# Patient Record
Sex: Male | Born: 1985 | Race: White | Hispanic: No | Marital: Married | Smoking: Never smoker
Health system: Southern US, Community
[De-identification: ages and names within clinical notes are randomized; demographics above are authoritative.]

## PROBLEM LIST (undated history)

## (undated) DIAGNOSIS — E785 Hyperlipidemia, unspecified: Secondary | ICD-10-CM

## (undated) DIAGNOSIS — Z720 Tobacco use: Secondary | ICD-10-CM

## (undated) DIAGNOSIS — I2510 Atherosclerotic heart disease of native coronary artery without angina pectoris: Secondary | ICD-10-CM

## (undated) HISTORY — DX: Atherosclerotic heart disease of native coronary artery without angina pectoris: I25.10

## (undated) HISTORY — PX: NO PAST SURGERIES: SHX2092

## (undated) HISTORY — DX: Hyperlipidemia, unspecified: E78.5

---

## 2017-12-13 ENCOUNTER — Other Ambulatory Visit: Payer: Self-pay

## 2017-12-13 ENCOUNTER — Encounter: Payer: Self-pay | Admitting: Emergency Medicine

## 2017-12-13 ENCOUNTER — Emergency Department: Payer: Worker's Compensation

## 2017-12-13 ENCOUNTER — Emergency Department
Admission: EM | Admit: 2017-12-13 | Discharge: 2017-12-13 | Disposition: A | Discharge status: Home / Self Care | Payer: Self-pay | Attending: Emergency Medicine | Admitting: Emergency Medicine

## 2017-12-13 DIAGNOSIS — M5442 Lumbago with sciatica, left side: Secondary | ICD-10-CM | POA: Insufficient documentation

## 2017-12-13 MED ORDER — CYCLOBENZAPRINE HCL 10 MG PO TABS
5.0000 mg | ORAL_TABLET | Freq: Once | ORAL | Status: AC
  Administered 2017-12-13: 5 mg via ORAL
  Filled 2017-12-13: qty 1

## 2017-12-13 MED ORDER — CYCLOBENZAPRINE HCL 5 MG PO TABS: ORAL_TABLET | ORAL | 0 refills | Status: DC

## 2017-12-13 MED ORDER — PREDNISONE 10 MG PO TABS: ORAL_TABLET | ORAL | 0 refills | Status: DC

## 2017-12-13 MED ORDER — METHYLPREDNISOLONE SODIUM SUCC 125 MG IJ SOLR
125.0000 mg | Freq: Once | INTRAMUSCULAR | Status: AC
  Administered 2017-12-13: 125 mg via INTRAMUSCULAR
  Filled 2017-12-13: qty 2

## 2017-12-13 MED ORDER — LIDOCAINE 5 % EX PTCH: 1.0000 | MEDICATED_PATCH | CUTANEOUS | 0 refills | Status: DC

## 2017-12-13 MED ORDER — OXYCODONE-ACETAMINOPHEN 5-325 MG PO TABS: 1.0000 | ORAL_TABLET | Freq: Four times a day (QID) | ORAL | 0 refills | Status: DC | PRN

## 2017-12-13 MED ORDER — OXYCODONE-ACETAMINOPHEN 5-325 MG PO TABS
1.0000 | ORAL_TABLET | Freq: Once | ORAL | Status: AC
  Administered 2017-12-13: 1 via ORAL
  Filled 2017-12-13: qty 1

## 2017-12-13 MED ORDER — LIDOCAINE 5 % EX PTCH
1.0000 | MEDICATED_PATCH | CUTANEOUS | Status: DC
  Administered 2017-12-13: 1 via TRANSDERMAL
  Filled 2017-12-13: qty 1

## 2017-12-13 MED ORDER — KETOROLAC TROMETHAMINE 30 MG/ML IJ SOLN
30.0000 mg | Freq: Once | INTRAMUSCULAR | Status: AC
  Administered 2017-12-13: 30 mg via INTRAMUSCULAR
  Filled 2017-12-13: qty 1

## 2017-12-13 MED ORDER — KETOROLAC TROMETHAMINE 10 MG PO TABS: 10.0000 mg | ORAL_TABLET | Freq: Four times a day (QID) | ORAL | 0 refills | Status: DC | PRN

## 2017-12-13 NOTE — ED Notes (Signed)
meds given as ordered. Awaiting xray results and disposition

## 2017-12-13 NOTE — ED Provider Notes (Signed)
[facility] Emergency Department Provider Note  ____________________________________________  Time seen: Approximately 12:42 PM  I have reviewed the triage vital signs and the nursing notes.   HISTORY  Chief Complaint Back Injury    HPI Jim Medina is a 32 y.o. male presents emergency department for evaluation of low back pain after injury at work 2 days ago.  Patient was lifting 2 by fours at work when he felt immediate pain in his back that took him to his knees.  He occasionally gets shooting pains down his left leg.  He is tried icing back and taking Tylenol and ibuprofen over the weekend but pain has continued.  No bowel or bladder dysfunction or saddle anesthesias.  No numbness, tingling, weakness to lower extremities.  History reviewed. No pertinent past medical history.  There are no active problems to display for this patient.   History reviewed. No pertinent surgical history.  Prior to Admission medications   Medication Sig Start Date End Date Taking? Authorizing Provider  cyclobenzaprine (FLEXERIL) 5 MG tablet Take 1-2 tablets 3 times daily as needed 12/13/17   Enid Derry, PA-C  ketorolac (TORADOL) 10 MG tablet Take 1 tablet (10 mg total) by mouth every 6 (six) hours as needed. 12/13/17   Enid Derry, PA-C  lidocaine (LIDODERM) 5 % Place 1 patch onto the skin daily. Remove & Discard patch within 12 hours or as directed by MD 12/13/17   Enid Derry, PA-C  oxyCODONE-acetaminophen (PERCOCET) 5-325 MG tablet Take 1 tablet by mouth every 6 (six) hours as needed for severe pain. 12/13/17 12/13/18  Enid Derry, PA-C  predniSONE (DELTASONE) 10 MG tablet Take 6 tablets on day 1, take 5 tablets on day 2, take 4 tablets on day 3, take 3 tablets on day 4, take 2 tablets on day 5, take 1 tablet on day 6 12/13/17   Enid Derry, PA-C    Allergies Patient has no known allergies.  No family history on file.  Social History Social History    Tobacco Use  . Smoking status: Never Smoker  . Smokeless tobacco: Never Used  Substance Use Topics  . Alcohol use: Not on file  . Drug use: Not on file     Review of Systems  Constitutional: No fever/chills ENT: No upper respiratory complaints. Cardiovascular: No chest pain. Respiratory: No cough. No SOB. Gastrointestinal: No abdominal pain.  No nausea, no vomiting.  Musculoskeletal: Positive for back pain.  Skin: Negative for rash, abrasions, lacerations, ecchymosis. Neurological: Negative for headaches, numbness or tingling   ____________________________________________   PHYSICAL EXAM:  VITAL SIGNS: ED Triage Vitals  Enc Vitals Group     BP 12/13/17 1140 (!) 192/102     Pulse Rate 12/13/17 1140 72     Resp 12/13/17 1140 14     Temp 12/13/17 1140 98.6 F (37 C)     Temp Source 12/13/17 1140 Oral     SpO2 12/13/17 1140 96 %     Weight 12/13/17 1137 215 lb (97.5 kg)     Height 12/13/17 1137 5\' 10"  (1.778 m)     Head Circumference --      Peak Flow --      Pain Score 12/13/17 1136 8     Pain Loc --      Pain Edu? --      Excl. in GC? --      Constitutional: Alert and oriented. Well appearing and in no acute distress. Eyes: Conjunctivae are normal. PERRL. EOMI. Head:  Atraumatic. ENT:      Ears:      Nose: No congestion/rhinnorhea.      Mouth/Throat: Mucous membranes are moist.  Neck: No stridor.  Cardiovascular: Normal rate, regular rhythm.  Good peripheral circulation. Respiratory: Normal respiratory effort without tachypnea or retractions. Lungs CTAB. Good air entry to the bases with no decreased or absent breath sounds. Musculoskeletal: Full range of motion to all extremities. No gross deformities appreciated.  Tenderness to palpation to mid lumbar spine.  Strength and sensation equal in lower extremity's bilaterally.  No foot drop.  Antalgic gait Neurologic:  Normal speech and language. No gross focal neurologic deficits are appreciated.  Skin:  Skin is  warm, dry and intact. No rash noted. Psychiatric: Mood and affect are normal. Speech and behavior are normal. Patient exhibits appropriate insight and judgement.   ____________________________________________   LABS (all labs ordered are listed, but only abnormal results are displayed)  Labs Reviewed - No data to display ____________________________________________  EKG   ____________________________________________  RADIOLOGY Lexine Baton, personally viewed and evaluated these images (plain radiographs) as part of my medical decision making, as well as reviewing the written report by the radiologist.  Dg Lumbar Spine 2-3 Views  Result Date: 12/13/2017 CLINICAL DATA:  Injury to lower back while moving heavy objects. Central pain. EXAM: LUMBAR SPINE - 2-3 VIEW COMPARISON:  None. FINDINGS: Transitional S1, unusually well visualized S1-S2 interspace. There is no evidence of lumbar spine fracture. Alignment is normal. Intervertebral disc spaces are maintained, except for disc space narrowing at L5-S1. No visible pars defects. IMPRESSION: No acute findings.  DDD L5-S1 Electronically Signed   By: Elsie Stain M.D.   On: 12/13/2017 13:20    ____________________________________________    PROCEDURES  Procedure(s) performed:    Procedures    Medications  lidocaine (LIDODERM) 5 % 1 patch (1 patch Transdermal Patch Applied 12/13/17 1249)  oxyCODONE-acetaminophen (PERCOCET/ROXICET) 5-325 MG per tablet 1 tablet (has no administration in time range)  methylPREDNISolone sodium succinate (SOLU-MEDROL) 125 mg/2 mL injection 125 mg (has no administration in time range)  ketorolac (TORADOL) 30 MG/ML injection 30 mg (30 mg Intramuscular Given 12/13/17 1250)  cyclobenzaprine (FLEXERIL) tablet 5 mg (5 mg Oral Given 12/13/17 1251)  oxyCODONE-acetaminophen (PERCOCET/ROXICET) 5-325 MG per tablet 1 tablet (1 tablet Oral Given 12/13/17 1250)      ____________________________________________   INITIAL IMPRESSION / ASSESSMENT AND PLAN / ED COURSE  Pertinent labs & imaging results that were available during my care of the patient were reviewed by me and considered in my medical decision making (see chart for details).  Review of the [place] CSRS was performed in accordance of the [facility] prior to dispensing any controlled drugs.   Patient presented emergency department for evaluation of low back pain after work injury 2 days ago.  Vital signs and exam are reassuring.  Patient is neurologically intact.  Is patient will be discharged home with prescriptions for Toradol, Flexeril, prednisone, short course of Percocet. Patient is to follow up with ortho as directed. Patient is given ED precautions to return to the ED for any worsening or new symptoms.     ____________________________________________  FINAL CLINICAL IMPRESSION(S) / ED DIAGNOSES  Final diagnoses:  Acute left-sided low back pain with left-sided sciatica      NEW MEDICATIONS STARTED DURING THIS VISIT:  ED Discharge Orders         Ordered    oxyCODONE-acetaminophen (PERCOCET) 5-325 MG tablet  Every 6 hours PRN  12/13/17 1416    ketorolac (TORADOL) 10 MG tablet  Every 6 hours PRN     12/13/17 1416    predniSONE (DELTASONE) 10 MG tablet     12/13/17 1416    cyclobenzaprine (FLEXERIL) 5 MG tablet     12/13/17 1416    lidocaine (LIDODERM) 5 %  Every 24 hours     12/13/17 1416              This chart was dictated using voice recognition software/Dragon. Despite best efforts to proofread, errors can occur which can change the meaning. Any change was purely unintentional.    Enid Derry, PA-C 12/13/17 1816    Pershing Proud Myra Rude, MD 12/14/17 2214

## 2017-12-13 NOTE — ED Notes (Signed)
This EDT along with EDT Jim Medina entered room and explained drug test. Pt then refused stating "I know I will fail. I took some things the other day." EDT let patient know a refusal is considered a failed test. Patient said he understood.

## 2017-12-13 NOTE — ED Notes (Signed)
Spoke  With  Owner   Of the   [employer]  -  Josh Mcdowell  Who states pt does need a  Urine   drug screen

## 2017-12-13 NOTE — ED Triage Notes (Signed)
C/O injury lower back on Friday while at work, moving 2x4's.

## 2018-10-08 ENCOUNTER — Inpatient Hospital Stay
Admission: EM | Admit: 2018-10-08 | Discharge: 2018-10-10 | DRG: 247 | Disposition: A | Discharge status: Home / Self Care | Payer: 59 | Attending: Internal Medicine | Admitting: Internal Medicine

## 2018-10-08 ENCOUNTER — Other Ambulatory Visit: Payer: Self-pay

## 2018-10-08 ENCOUNTER — Emergency Department: Payer: 59

## 2018-10-08 DIAGNOSIS — Z20828 Contact with and (suspected) exposure to other viral communicable diseases: Secondary | ICD-10-CM | POA: Diagnosis present

## 2018-10-08 DIAGNOSIS — I2510 Atherosclerotic heart disease of native coronary artery without angina pectoris: Secondary | ICD-10-CM | POA: Diagnosis present

## 2018-10-08 DIAGNOSIS — E781 Pure hyperglyceridemia: Secondary | ICD-10-CM | POA: Diagnosis present

## 2018-10-08 DIAGNOSIS — R079 Chest pain, unspecified: Secondary | ICD-10-CM | POA: Diagnosis not present

## 2018-10-08 DIAGNOSIS — G4440 Drug-induced headache, not elsewhere classified, not intractable: Secondary | ICD-10-CM | POA: Diagnosis present

## 2018-10-08 DIAGNOSIS — I517 Cardiomegaly: Secondary | ICD-10-CM | POA: Diagnosis not present

## 2018-10-08 DIAGNOSIS — F17200 Nicotine dependence, unspecified, uncomplicated: Secondary | ICD-10-CM | POA: Diagnosis present

## 2018-10-08 DIAGNOSIS — R911 Solitary pulmonary nodule: Secondary | ICD-10-CM | POA: Diagnosis present

## 2018-10-08 DIAGNOSIS — I200 Unstable angina: Secondary | ICD-10-CM | POA: Diagnosis not present

## 2018-10-08 DIAGNOSIS — E785 Hyperlipidemia, unspecified: Secondary | ICD-10-CM | POA: Diagnosis present

## 2018-10-08 DIAGNOSIS — T463X5A Adverse effect of coronary vasodilators, initial encounter: Secondary | ICD-10-CM | POA: Diagnosis present

## 2018-10-08 DIAGNOSIS — E782 Mixed hyperlipidemia: Secondary | ICD-10-CM | POA: Diagnosis not present

## 2018-10-08 DIAGNOSIS — I214 Non-ST elevation (NSTEMI) myocardial infarction: Secondary | ICD-10-CM | POA: Diagnosis present

## 2018-10-08 HISTORY — DX: Tobacco use: Z72.0

## 2018-10-08 LAB — CBC
HCT: 43.5 % (ref 39.0–52.0)
Hemoglobin: 14.6 g/dL (ref 13.0–17.0)
MCH: 28 pg (ref 26.0–34.0)
MCHC: 33.6 g/dL (ref 30.0–36.0)
MCV: 83.3 fL (ref 80.0–100.0)
Platelets: 368 10*3/uL (ref 150–400)
RBC: 5.22 MIL/uL (ref 4.22–5.81)
RDW: 13.5 % (ref 11.5–15.5)
WBC: 16.4 10*3/uL — ABNORMAL HIGH (ref 4.0–10.5)
nRBC: 0 % (ref 0.0–0.2)

## 2018-10-08 LAB — TROPONIN I (HIGH SENSITIVITY)
Troponin I (High Sensitivity): 1552 ng/L (ref <18)
Troponin I (High Sensitivity): 4199 ng/L (ref <18)
Troponin I (High Sensitivity): 7178 ng/L (ref <18)

## 2018-10-08 LAB — BASIC METABOLIC PANEL
Anion gap: 13 (ref 5–15)
BUN: 19 mg/dL (ref 6–20)
CO2: 22 mmol/L (ref 22–32)
Calcium: 9.4 mg/dL (ref 8.9–10.3)
Chloride: 104 mmol/L (ref 98–111)
Creatinine, Ser: 0.9 mg/dL (ref 0.61–1.24)
GFR calc Af Amer: >60 mL/min (ref >60)
GFR calc non Af Amer: >60 mL/min (ref >60)
Glucose, Bld: 106 mg/dL — ABNORMAL HIGH (ref 70–99)
Potassium: 3.9 mmol/L (ref 3.5–5.1)
Sodium: 139 mmol/L (ref 135–145)

## 2018-10-08 LAB — SARS CORONAVIRUS 2 BY RT PCR (HOSPITAL ORDER, PERFORMED IN ~~LOC~~ HOSPITAL LAB): SARS Coronavirus 2: NEGATIVE

## 2018-10-08 LAB — APTT: aPTT: 35 seconds (ref 24–36)

## 2018-10-08 LAB — PROTIME-INR
INR: 1 (ref 0.8–1.2)
Prothrombin Time: 13.4 seconds (ref 11.4–15.2)

## 2018-10-08 IMAGING — CT CT ANGIO CHEST
2 of 6 series · 18 of 36 positions shown · IV contrast (APPLIED)
Comparison: Chest radiograph same day

CLINICAL DATA: Chest pain slightly positional

EXAM:
CT ANGIOGRAPHY CHEST WITH CONTRAST
TECHNIQUE: Multidetector CT imaging of the chest was performed using the
standard protocol during bolus administration of intravenous
contrast. Multiplanar CT image reconstructions and MIPs were
obtained to evaluate the vascular anatomy.
CONTRAST:  100mL OMNIPAQUE IOHEXOL 350 MG/ML SOLN

[Series 6: thins · axial · 0.69mm/px · z∈[-355,-82]mm · 17 of 305 slices shown]
[im 16/305  lung]
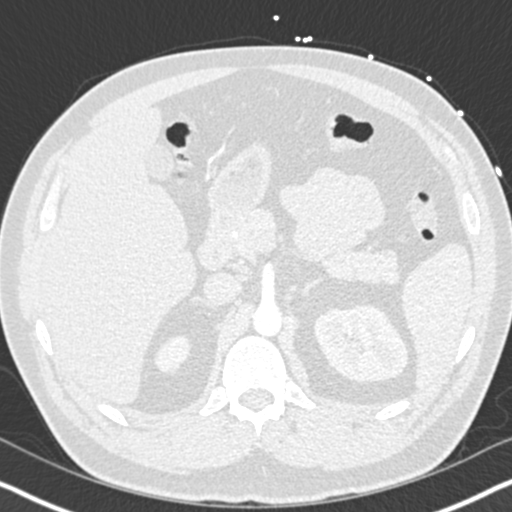
[im 31/305  mediastinal]
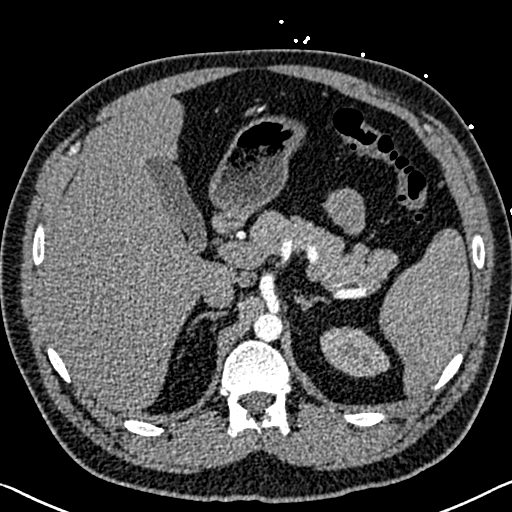
[im 46/305  lung]
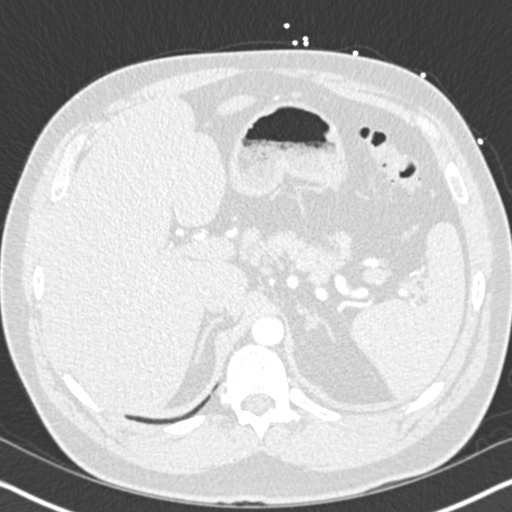
[im 61/305  mediastinal]
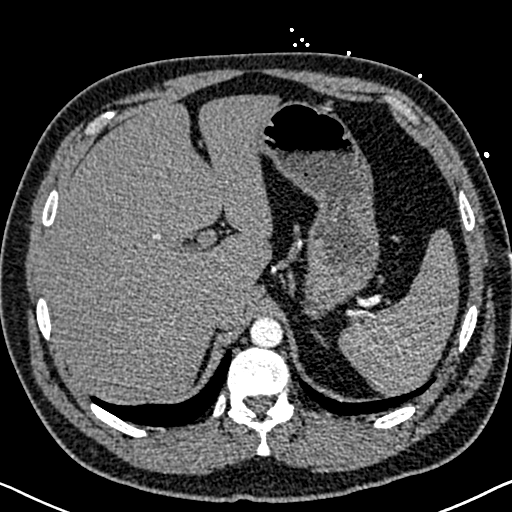
[im 92/305  lung]
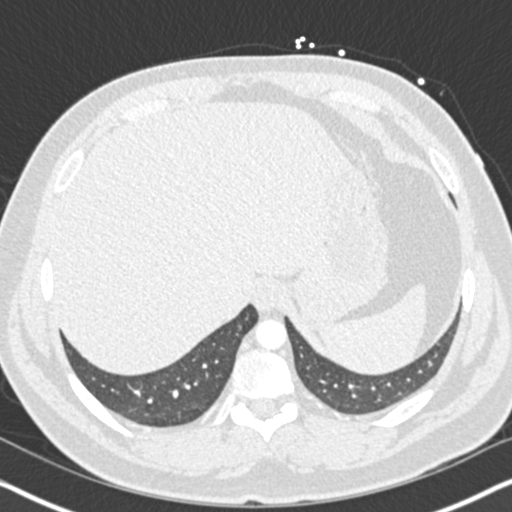
[im 107/305  mediastinal]
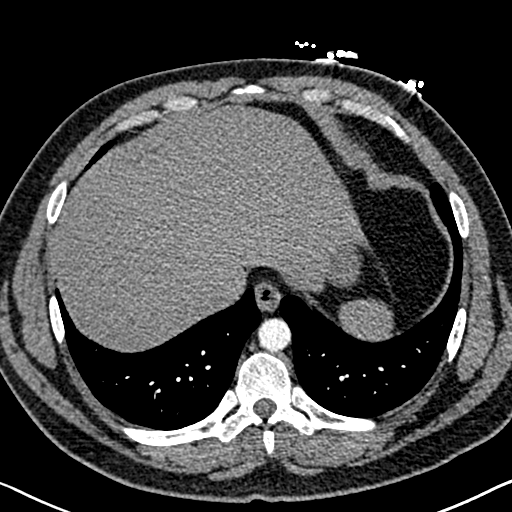
[im 122/305  lung]
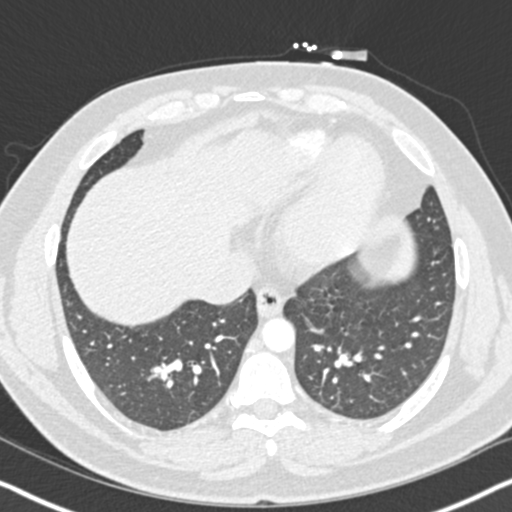
[im 137/305  mediastinal]
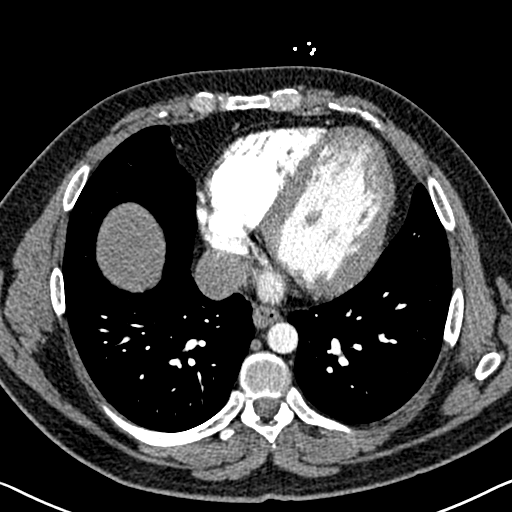
[im 153/305  lung]
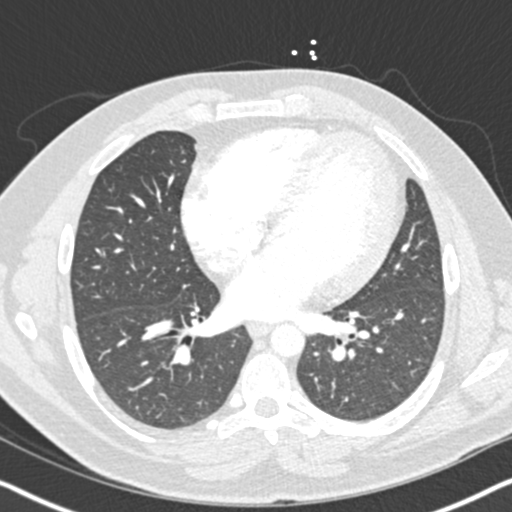
[im 168/305  mediastinal]
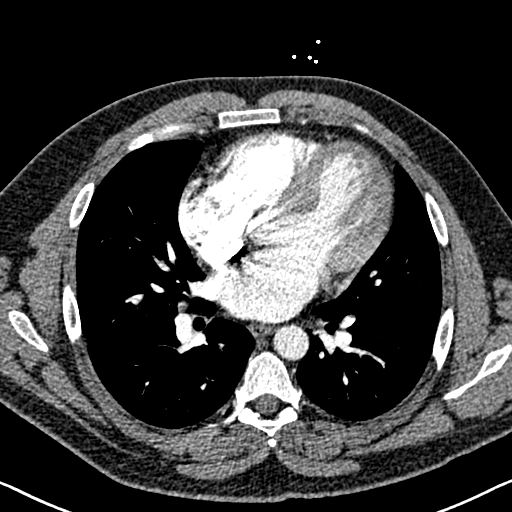
[im 183/305  lung]
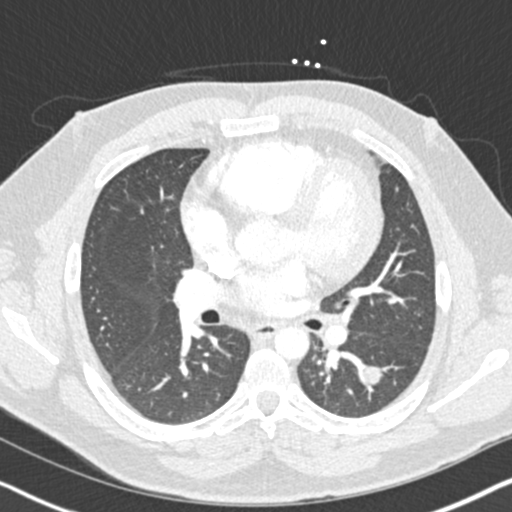
[im 198/305  mediastinal]
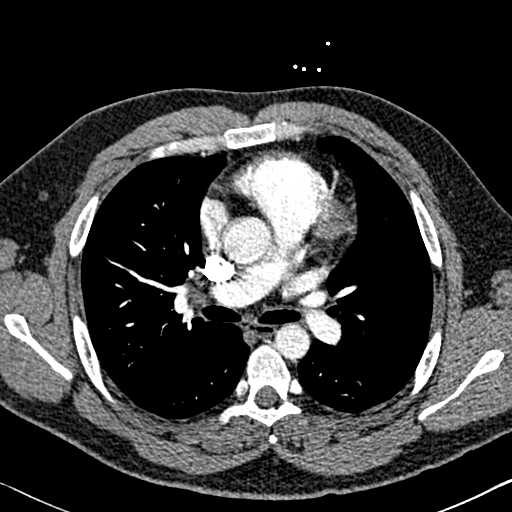
[im 213/305  lung]
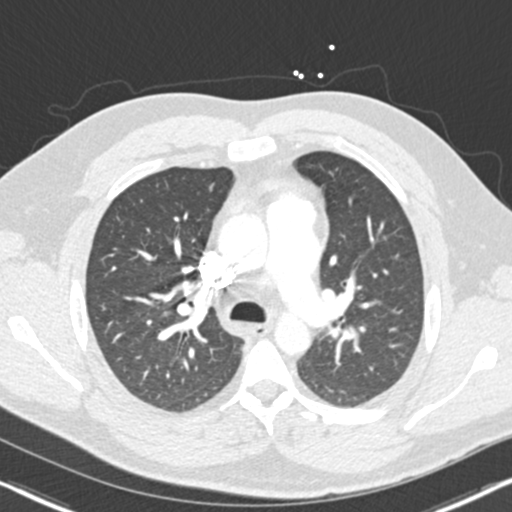
[im 244/305  mediastinal]
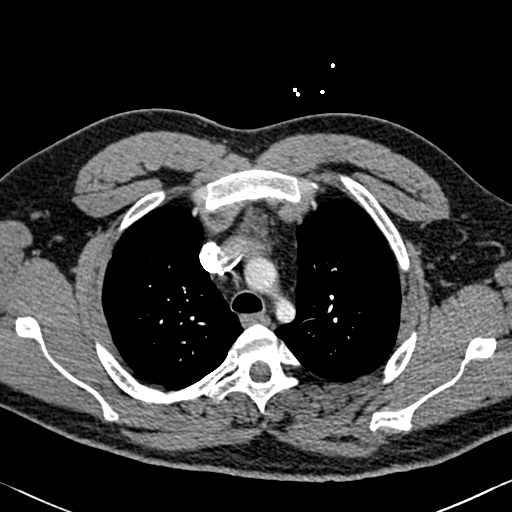
[im 259/305  lung]
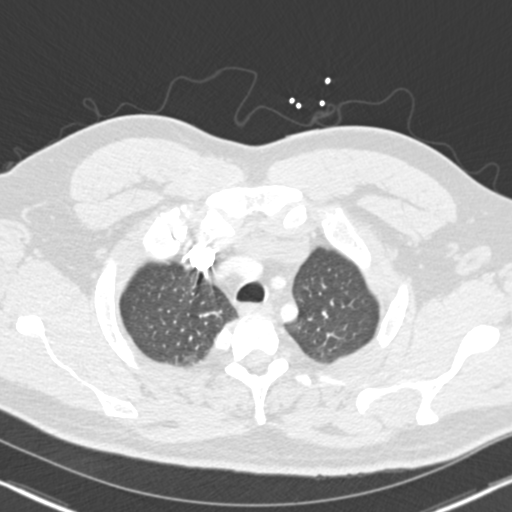
[im 274/305  mediastinal]
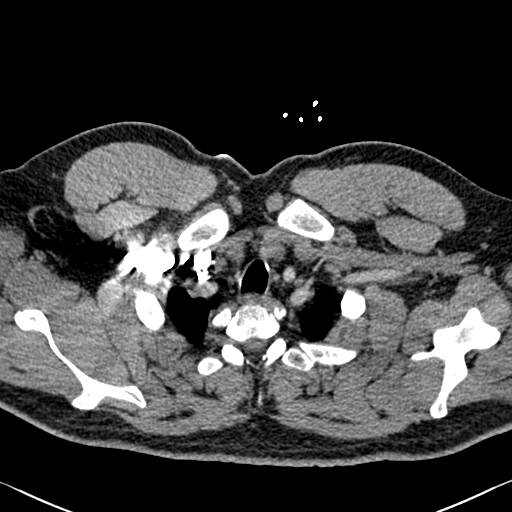
[im 289/305  lung]
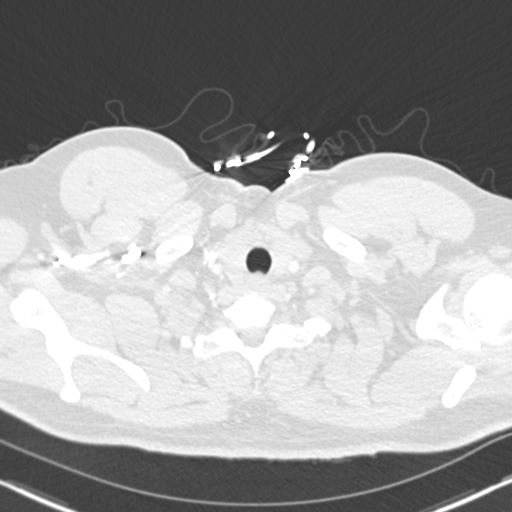

[Series 8: coronal mpr · coronal · 0.63mm/px · 1 of 99 slices shown]
[im 50/99  mediastinal]
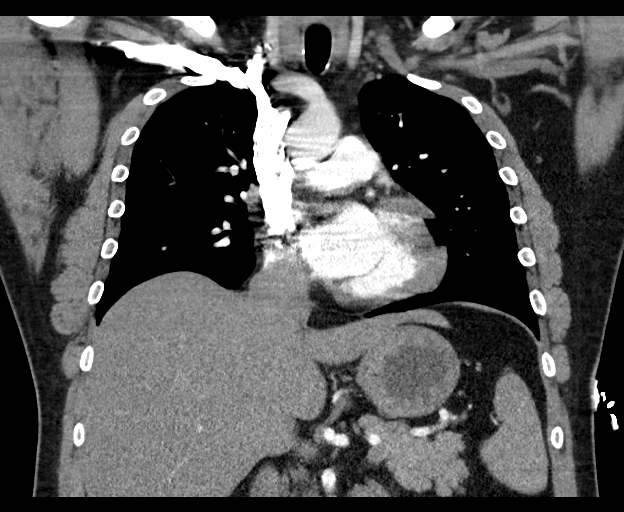

[18 of 36 positions shown; findings below may reference images not displayed]

FINDINGS: Cardiovascular: There is a optimal opacification of the pulmonary
arteries. There is no central,segmental, or subsegmental filling
defects within the pulmonary arteries. The heart is normal in size.
No pericardial effusion thickening. No evidence right heart strain.
There is normal three-vessel brachiocephalic anatomy without
proximal stenosis. The thoracic aorta is normal in appearance.

Mediastinum/Nodes: No hilar, mediastinal, or axillary adenopathy.
Thyroid gland, trachea, and esophagus demonstrate no significant
findings.

Lungs/Pleura: There is a solid 1.7 cm pulmonary nodule seen in the
posterior left upper lobe adjacent to pulmonary venous vasculature.
The remainder of the lungs are clear. No pleural effusion.

Upper Abdomen: No acute abnormalities present in the visualized
portions of the upper abdomen.

Musculoskeletal: No chest wall abnormality. No acute or significant
osseous findings.

Review of the MIP images confirms the above findings.
IMPRESSION: 1. No acute central, segmental, or subsegmental pulmonary embolism.
2. 1.7 cm solid pulmonary nodule in the posterior left upper lobe.
Consider one of the following in 3 months for both low-risk and
high-risk individuals: (a) repeat chest CT, (b) follow-up PET-CT, or
(c) tissue sampling. This recommendation follows the consensus
statement: Guidelines for Management of Incidental Pulmonary Nodules
Detected on CT Images: From the [HOSPITAL] [0M]; Radiology

## 2018-10-08 MED ORDER — NITROGLYCERIN IN D5W 200-5 MCG/ML-% IV SOLN
0.0000 ug/min | INTRAVENOUS | Status: DC
  Administered 2018-10-08: 22:00:00 10 ug/min via INTRAVENOUS
  Administered 2018-10-08: 15 ug/min via INTRAVENOUS

## 2018-10-08 MED ORDER — NITROGLYCERIN IN D5W 200-5 MCG/ML-% IV SOLN
0.0000 ug/min | INTRAVENOUS | Status: DC
  Administered 2018-10-08: 21:00:00 5 ug/min via INTRAVENOUS
  Filled 2018-10-08: qty 250

## 2018-10-08 MED ORDER — ACETAMINOPHEN 325 MG PO TABS: 650.0000 mg | ORAL_TABLET | Freq: Four times a day (QID) | ORAL | Status: DC | PRN

## 2018-10-08 MED ORDER — MORPHINE SULFATE (PF) 4 MG/ML IV SOLN: 4.0000 mg | Freq: Once | INTRAVENOUS | Status: DC

## 2018-10-08 MED ORDER — NITROGLYCERIN 0.4 MG SL SUBL
SUBLINGUAL_TABLET | SUBLINGUAL | Status: AC
  Filled 2018-10-08: qty 1

## 2018-10-08 MED ORDER — ONDANSETRON HCL 4 MG/2ML IJ SOLN
4.0000 mg | Freq: Four times a day (QID) | INTRAMUSCULAR | Status: DC | PRN
  Administered 2018-10-09: 4 mg via INTRAVENOUS
  Filled 2018-10-08: qty 2

## 2018-10-08 MED ORDER — MORPHINE SULFATE (PF) 2 MG/ML IV SOLN
2.0000 mg | INTRAVENOUS | Status: DC | PRN
  Administered 2018-10-09 (×2): 2 mg via INTRAVENOUS
  Filled 2018-10-08 (×2): qty 1

## 2018-10-08 MED ORDER — NITROPRUSSIDE SODIUM-NACL 10-0.9 MG/50ML-% IV SOLN: 0.0000 ug/kg/min | INTRAVENOUS | Status: DC

## 2018-10-08 MED ORDER — HEPARIN (PORCINE) 25000 UT/250ML-% IV SOLN
1700.0000 [IU]/h | INTRAVENOUS | Status: DC
  Administered 2018-10-08: 20:00:00 1250 [IU]/h via INTRAVENOUS
  Administered 2018-10-09: 07:00:00 1550 [IU]/h via INTRAVENOUS
  Filled 2018-10-08 (×2): qty 250

## 2018-10-08 MED ORDER — HEPARIN BOLUS VIA INFUSION
4000.0000 [IU] | Freq: Once | INTRAVENOUS | Status: AC
  Administered 2018-10-08: 4000 [IU] via INTRAVENOUS
  Filled 2018-10-08: qty 4000

## 2018-10-08 MED ORDER — OXYCODONE HCL 5 MG PO TABS
5.0000 mg | ORAL_TABLET | ORAL | Status: DC | PRN
  Administered 2018-10-09 (×2): 5 mg via ORAL
  Filled 2018-10-08 (×2): qty 1

## 2018-10-08 MED ORDER — NITROGLYCERIN 0.4 MG SL SUBL
0.4000 mg | SUBLINGUAL_TABLET | SUBLINGUAL | Status: DC | PRN
  Administered 2018-10-08 (×2): 0.4 mg via SUBLINGUAL

## 2018-10-08 MED ORDER — MORPHINE SULFATE (PF) 4 MG/ML IV SOLN
4.0000 mg | Freq: Once | INTRAVENOUS | Status: AC
  Administered 2018-10-08: 21:00:00 4 mg via INTRAVENOUS
  Filled 2018-10-08: qty 1

## 2018-10-08 MED ORDER — IOHEXOL 350 MG/ML SOLN
100.0000 mL | Freq: Once | INTRAVENOUS | Status: AC | PRN
  Administered 2018-10-08: 100 mL via INTRAVENOUS

## 2018-10-08 MED ORDER — LORAZEPAM 2 MG/ML IJ SOLN: 1.0000 mg | Freq: Once | INTRAMUSCULAR | Status: DC

## 2018-10-08 MED ORDER — ACETAMINOPHEN 650 MG RE SUPP: 650.0000 mg | Freq: Four times a day (QID) | RECTAL | Status: DC | PRN

## 2018-10-08 MED ORDER — ASPIRIN 81 MG PO CHEW
324.0000 mg | CHEWABLE_TABLET | Freq: Once | ORAL | Status: AC
  Administered 2018-10-08: 324 mg via ORAL
  Filled 2018-10-08: qty 4

## 2018-10-08 MED ORDER — ONDANSETRON HCL 4 MG PO TABS: 4.0000 mg | ORAL_TABLET | Freq: Four times a day (QID) | ORAL | Status: DC | PRN

## 2018-10-08 NOTE — Progress Notes (Signed)
ANTICOAGULATION CONSULT NOTE - Initial Consult  Pharmacy Consult for Heparin Drip  Indication: chest pain/ACS  No Known Allergies  Patient Measurements: Height: 5\' 10"  (177.8 cm) Weight: 230 lb (104.3 kg) IBW/kg (Calculated) : 73 Heparin Dosing Weight: 95.2 kg  Vital Signs: Temp: 98.4 F (36.9 C) (09/04 1722) Temp Source: Oral (09/04 1722) BP: 152/99 (09/04 1917) Pulse Rate: 70 (09/04 1917)  Labs: Recent Labs    10/08/18 1724  HGB 14.6  HCT 43.5  PLT 368  CREATININE 0.90  TROPONINIHS 1,552*    Estimated Creatinine Clearance: 143.8 mL/min (by C-G formula based on SCr of 0.9 mg/dL).   Medical History: History reviewed. No pertinent past medical history.  Assessment: Patient is a 33yo male admitted with chest pain and elevated troponin. Pharmacy consulted for Heparin dosing.  Goal of Therapy:  Heparin level 0.3-0.7 units/ml Monitor platelets by anticoagulation protocol: Yes   Plan:  Give 4000 units bolus x 1 Start heparin infusion at 1250 units/hr Check anti-Xa level in 6 hours and daily while on heparin Continue to monitor H&H and platelets  Paulina Fusi, PharmD, BCPS 10/08/2018 7:48 PM

## 2018-10-08 NOTE — ED Provider Notes (Addendum)
[facility] Emergency Department Provider Note  ____________________________________________   First MD Initiated Contact with Patient 10/08/18 1907     (approximate)  I have reviewed the triage vital signs and the nursing notes.   HISTORY  Chief Complaint Abdominal Pain and Chest Pain    HPI Jim Medina is a 33 y.o. male  Here with chest pressure and pain.  Patient states that his symptoms started earlier this morning.  He states that while getting ready for work he experienced acute onset of dull, aching, substernal chest pressure.  He had some mild shortness of breath with it.  He tried to go to work to see if it would go away, and while at work experience worsening of this pain.  Experiencing lightheadedness.  He was sweating but it was also very hot.  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  States that because his pain got progressively more severe and he could not even work, he presents for further evaluation.  Denies any fevers or chills.  No recent or preceding illnesses.  Denies any known personal or family history of sudden coronary death or cardiac death.  No history of early coronary disease.  He does not smoke.  Denies any known COVID exposures.  No lower extremity swelling or recent mobilization or history of DVT/PE.  Pain seems to be somewhat constant, not positional.  It does not worsen with deep inspiration.       Past Medical History:  Diagnosis Date   Tobacco use     Patient Active Problem List   Diagnosis Date Noted   NSTEMI (non-ST elevated myocardial infarction) (HCC) 10/08/2018    Past Surgical History:  Procedure Laterality Date   NO PAST SURGERIES      Prior to Admission medications   Medication Sig Start Date End Date Taking? Authorizing Provider  cyclobenzaprine (FLEXERIL) 5 MG tablet Take 1-2 tablets 3 times daily as needed Patient not taking: Reported on 10/08/2018 12/13/17   [place] DerryWagner, Ashley, PA-C  ketorolac (TORADOL) 10 MG tablet  Take 1 tablet (10 mg total) by mouth every 6 (six) hours as needed. Patient not taking: Reported on 10/08/2018 12/13/17   Enid DerryWagner, Ashley, PA-C  lidocaine (LIDODERM) 5 % Place 1 patch onto the skin daily. Remove & Discard patch within 12 hours or as directed by MD Patient not taking: Reported on 10/08/2018 12/13/17   Enid DerryWagner, Ashley, PA-C  oxyCODONE-acetaminophen (PERCOCET) 5-325 MG tablet Take 1 tablet by mouth every 6 (six) hours as needed for severe pain. Patient not taking: Reported on 10/08/2018 12/13/17 12/13/18  Enid DerryWagner, Ashley, PA-C  predniSONE (DELTASONE) 10 MG tablet Take 6 tablets on day 1, take 5 tablets on day 2, take 4 tablets on day 3, take 3 tablets on day 4, take 2 tablets on day 5, take 1 tablet on day 6 Patient not taking: Reported on 10/08/2018 12/13/17   Enid DerryWagner, Ashley, PA-C    Allergies Patient has no known allergies.  History reviewed. No pertinent family history.  Social History Social History   Tobacco Use   Smoking status: Never Smoker   Smokeless tobacco: Never Used  Substance Use Topics   Alcohol use: Yes   Drug use: Not on file    Review of Systems  Review of Systems  Constitutional: Positive for fatigue. Negative for chills and fever.  HENT: Negative for sore throat.   Respiratory: Positive for chest tightness and shortness of breath.   Cardiovascular: Positive for chest pain.  Gastrointestinal: Negative for abdominal pain.  Genitourinary: Negative for flank pain.  Musculoskeletal: Negative for neck pain.  Skin: Negative for rash and wound.  Allergic/Immunologic: Negative for immunocompromised state.  Neurological: Positive for weakness. Negative for numbness.  Hematological: Does not bruise/bleed easily.  All other systems reviewed and are negative.    ____________________________________________  PHYSICAL EXAM:      VITAL SIGNS: ED Triage Vitals [10/08/18 1722]  Enc Vitals Group     BP 140/89     Pulse Rate 99     Resp 16     Temp 98.4 F  (36.9 C)     Temp Source Oral     SpO2 100 %     Weight 230 lb (104.3 kg)     Height 5\' 10"  (1.778 m)     Head Circumference      Peak Flow      Pain Score 6     Pain Loc      Pain Edu?      Excl. in [place]?      Physical Exam Vitals signs and nursing note reviewed.  Constitutional:      General: He is not in acute distress.    Appearance: He is well-developed.     Comments: Appears uncomfortable  HENT:     Head: Normocephalic and atraumatic.  Eyes:     Conjunctiva/sclera: Conjunctivae normal.  Neck:     Musculoskeletal: Neck supple.  Cardiovascular:     Rate and Rhythm: Normal rate and regular rhythm.     Heart sounds: Normal heart sounds. No murmur. No friction rub.  Pulmonary:     Effort: Pulmonary effort is normal. No respiratory distress.     Breath sounds: Normal breath sounds. No wheezing or rales.  Abdominal:     General: There is no distension.     Palpations: Abdomen is soft.     Tenderness: There is no abdominal tenderness. There is no guarding or rebound.  Skin:    General: Skin is warm.     Capillary Refill: Capillary refill takes less than 2 seconds.  Neurological:     Mental Status: He is alert and oriented to person, place, and time.     Motor: No abnormal muscle tone.       ____________________________________________   LABS (all labs ordered are listed, but only abnormal results are displayed)  Labs Reviewed  BASIC METABOLIC PANEL - Abnormal; Notable for the following components:      Result Value   Glucose, Bld 106 (*)    All other components within normal limits  CBC - Abnormal; Notable for the following components:   WBC 16.4 (*)    All other components within normal limits  URINE DRUG SCREEN, QUALITATIVE ([facility] ONLY) - Abnormal; Notable for the following components:   Opiate, Ur Screen POSITIVE (*)    Cannabinoid 50 Ng, Ur [place] POSITIVE (*)    All other components within normal limits  TROPONIN I (HIGH SENSITIVITY) - Abnormal; Notable for  the following components:   Troponin I (High Sensitivity) 1,552 (*)    All other components within normal limits  TROPONIN I (HIGH SENSITIVITY) - Abnormal; Notable for the following components:   Troponin I (High Sensitivity) 4,199 (*)    All other components within normal limits  TROPONIN I (HIGH SENSITIVITY) - Abnormal; Notable for the following components:   Troponin I (High Sensitivity) 7,178 (*)    All other components within normal limits  TROPONIN I (HIGH SENSITIVITY) - Abnormal; Notable for the following components:  Troponin I (High Sensitivity) 8,723 (*)    All other components within normal limits  SARS CORONAVIRUS 2 (HOSPITAL ORDER, PERFORMED IN [facility])  APTT  PROTIME-INR  HEPARIN LEVEL (UNFRACTIONATED)  CBC  HIV ANTIBODY (ROUTINE TESTING W REFLEX)  BASIC METABOLIC PANEL  TROPONIN I (HIGH SENSITIVITY)    ____________________________________________  EKG 1: Normal sinus rhythm, ventricular rate 100.  Old lateral infarct noted.  No old tracing.  EKG 2: Sinus rhythm, tachycardia rate 74.  PVC is now noted.  There is subtle ST flattening in V1 and 2 with 1 mm of elevation in V2 but not quite 1 mm in V1.  T wave appears inverted in aVL now when compared to prior. ________________________________________  RADIOLOGY All imaging, including plain films, CT scans, and ultrasounds, independently reviewed by me, and interpretations confirmed via formal radiology reads.  ED MD interpretation:   CXR: Pulmonary nodule noted, o/w no acute abnormality  Official radiology report(s): Dg Chest 2 View  Result Date: 10/08/2018 CLINICAL DATA:  Chest pain, epigastric pain, cramping EXAM: CHEST - 2 VIEW COMPARISON:  None FINDINGS: Upper normal heart size. Mediastinal contours and pulmonary vascularity normal. Nodular density LEFT mid lung 18 x 15 x 15 mm. Remaining lungs clear. No pleural effusion or pneumothorax. No acute osseous findings. IMPRESSION: LEFT mid lung nodule  18 x 15 x 15 mm, question in superior segment of the LEFT lower lobe; CT chest recommended to evaluate. Findings called to Dr. Erma Heritage on 10/08/2018 at 1803 hours. Electronically Signed   By: Ulyses Southward M.D.   On: 10/08/2018 18:05   Ct Angio Chest Pe W And/or Wo Contrast  Result Date: 10/08/2018 CLINICAL DATA:  Chest pain slightly positional EXAM: CT ANGIOGRAPHY CHEST WITH CONTRAST TECHNIQUE: Multidetector CT imaging of the chest was performed using the standard protocol during bolus administration of intravenous contrast. Multiplanar CT image reconstructions and MIPs were obtained to evaluate the vascular anatomy. CONTRAST:  OMNIPAQUE IOHEXOL 350 MG/ML SOLN COMPARISON:  Chest radiograph same day FINDINGS: Cardiovascular: There is a optimal opacification of the pulmonary arteries. There is no central,segmental, or subsegmental filling defects within the pulmonary arteries. The heart is normal in size. No pericardial effusion thickening. No evidence right heart strain. There is normal three-vessel brachiocephalic anatomy without proximal stenosis. The thoracic aorta is normal in appearance. Mediastinum/Nodes: No hilar, mediastinal, or axillary adenopathy. Thyroid gland, trachea, and esophagus demonstrate no significant findings. Lungs/Pleura: There is a solid 1.7 cm pulmonary nodule seen in the posterior left upper lobe adjacent to pulmonary venous vasculature. The remainder of the lungs are clear. No pleural effusion. Upper Abdomen: No acute abnormalities present in the visualized portions of the upper abdomen. Musculoskeletal: No chest wall abnormality. No acute or significant osseous findings. Review of the MIP images confirms the above findings. IMPRESSION: 1. No acute central, segmental, or subsegmental pulmonary embolism. 2. 1.7 cm solid pulmonary nodule in the posterior left upper lobe. Consider one of the following in 3 months for both low-risk and high-risk individuals: (a) repeat chest CT, (b)  follow-up PET-CT, or (c) tissue sampling. This recommendation follows the consensus statement: Guidelines for Management of Incidental Pulmonary Nodules Detected on CT Images: From the Fleischner Society 2017; Radiology 2017; 284:228-243. Electronically Signed   By: Jonna Clark M.D.   On: 10/08/2018 20:12    ____________________________________________  PROCEDURES   Procedure(s) performed (including Critical Care):  .Critical Care Performed by: Shaune Pollack, MD Authorized by: Shaune Pollack, MD   Critical care provider statement:  Critical care time (minutes):  35   Critical care time was exclusive of:  Separately billable procedures and treating other patients and teaching time   Critical care was necessary to treat or prevent imminent or life-threatening deterioration of the following conditions:  Cardiac failure, circulatory failure and respiratory failure   Critical care was time spent personally by me on the following activities:  Development of treatment plan with patient or surrogate, discussions with consultants, evaluation of patient's response to treatment, examination of patient, obtaining history from patient or surrogate, ordering and performing treatments and interventions, ordering and review of laboratory studies, ordering and review of radiographic studies, pulse oximetry, re-evaluation of patient's condition and review of old charts   I assumed direction of critical care for this patient from another provider in my specialty: no      ____________________________________________  INITIAL IMPRESSION / MDM / ASSESSMENT AND PLAN / ED COURSE  As part of my medical decision making, I reviewed the following data within the electronic MEDICAL RECORD NUMBER Notes from prior ED visits and [place] Controlled Substance Database      *Jim NevinsCorey Furlan was evaluated in Emergency Department on 10/09/2018 for the symptoms described in the history of present illness. He was evaluated in the  context of the global COVID-19 pandemic, which necessitated consideration that the patient might be at risk for infection with the SARS-CoV-2 virus that causes COVID-19. Institutional protocols and algorithms that pertain to the evaluation of patients at risk for COVID-19 are in a state of rapid change based on information released by regulatory bodies including the CDC and federal and state organizations. These policies and algorithms were followed during the patient's care in the ED.  Some ED evaluations and interventions may be delayed as a result of limited staffing during the pandemic.*      Medical Decision Making: 33 yo M here with concerning story of CP. EKG initially non-ischemic but trop >1000 on initial lab in triage. Pt immediately brought back to room, started on ASA and heparin gtt. Case discussed with Dr. Johney FrameAllred. Repeat EKG and trop uptrending, and EKG does show some ST flattening in V1-2 but reviewed the case with Dr. Johney FrameAllred - no STEMI, continue conservative management. IV nitro, morphine started. Admit to ICU for NSTEMI. Otherwise, no signs of PE, PNA. Lung nodule incidentally noted - will need f/u.  ____________________________________________  FINAL CLINICAL IMPRESSION(S) / ED DIAGNOSES  Final diagnoses:  NSTEMI (non-ST elevated myocardial infarction) (HCC)     MEDICATIONS GIVEN DURING THIS VISIT:  Medications  nitroGLYCERIN (NITROSTAT) SL tablet 0.4 mg (0.4 mg Sublingual Given 10/08/18 2005)  nitroGLYCERIN (NITROSTAT) 0.4 MG SL tablet (has no administration in time range)  heparin ADULT infusion 100 units/mL (25000 units/24450mL sodium chloride 0.45%) (1,250 Units/hr Intravenous New Bag/Given 10/08/18 2009)  acetaminophen (TYLENOL) tablet 650 mg (has no administration in time range)    Or  acetaminophen (TYLENOL) suppository 650 mg (has no administration in time range)  oxyCODONE (Oxy IR/ROXICODONE) immediate release tablet 5 mg (5 mg Oral Given 10/09/18 0040)  ondansetron  (ZOFRAN) tablet 4 mg (has no administration in time range)    Or  ondansetron (ZOFRAN) injection 4 mg (has no administration in time range)  morphine 2 MG/ML injection 2 mg (has no administration in time range)  nitroGLYCERIN 50 mg in dextrose 5 % 250 mL (0.2 mg/mL) infusion (15 mcg/min Intravenous New Bag/Given 10/08/18 2327)  aspirin chewable tablet 324 mg (324 mg Oral Given 10/08/18 1930)  heparin bolus via infusion  4,000 Units (4,000 Units Intravenous Bolus from Bag 10/08/18 2009)  iohexol (OMNIPAQUE) 350 MG/ML injection 100 mL (100 mLs Intravenous Contrast Given 10/08/18 1950)  morphine 4 MG/ML injection 4 mg (4 mg Intravenous Given 10/08/18 2046)     ED Discharge Orders    None       Note:  This document was prepared using Dragon voice recognition software and may include unintentional dictation errors.   Shaune Pollack, MD 10/09/18 [place]    Shaune Pollack, MD 10/25/18 [phone]

## 2018-10-08 NOTE — H&P (Signed)
[facility] [place] at [facility]   PATIENT NAME: Jim Medina    MR#:  [id number]  DATE OF BIRTH:  [birth year]  DATE OF ADMISSION:  10/08/2018  PRIMARY CARE PHYSICIAN: Patient, No Pcp Per   REQUESTING/REFERRING PHYSICIAN: Erma Heritage, MD  CHIEF COMPLAINT:   Chief Complaint  Patient presents with  . Abdominal Pain  . Chest Pain    HISTORY OF PRESENT ILLNESS:  Jim Medina  is a 33 y.o. male who presents with chief complaint as above.  Patient presents to the ED with a complaint of chest pain.  He describes the chest pain as crushing pressure, substernal without radiation, but with associated shortness of breath.  He has no prior history of heart disease or heart problems.  He does not know his mother or father's medical history well at all as he was raised by his grandparents.  He states that his grandparents do not have any history of heart disease.  Here in the ED he was found to have EKG changes, and his troponin was initially 1500 and then almost 5000.  He was started on a heparin drip.  Cardiology was notified by ED physician, and they recommend continuing to monitor and states that if his pain progresses or if his EKG undergoes further concerning changes they might consider cardiac cath tonight, versus tomorrow if he remains relatively stable from this point.  Hospitalist were called for admission  PAST MEDICAL HISTORY:   Past Medical History:  Diagnosis Date  . Tobacco use      PAST SURGICAL HISTORY:   Past Surgical History:  Procedure Laterality Date  . NO PAST SURGERIES       SOCIAL HISTORY:   Social History   Tobacco Use  . Smoking status: Light Tobacco Smoker  . Smokeless tobacco: Never Used  Substance Use Topics  . Alcohol use: Yes     FAMILY HISTORY:    Family history reviewed and is non-contributory DRUG ALLERGIES:  No Known Allergies  MEDICATIONS AT HOME:   Prior to Admission medications   Medication Sig Start Date End Date  Taking? Authorizing Provider  cyclobenzaprine (FLEXERIL) 5 MG tablet Take 1-2 tablets 3 times daily as needed Patient not taking: Reported on 10/08/2018 12/13/17   Enid Derry, PA-C  ketorolac (TORADOL) 10 MG tablet Take 1 tablet (10 mg total) by mouth every 6 (six) hours as needed. Patient not taking: Reported on 10/08/2018 12/13/17   Enid Derry, PA-C  lidocaine (LIDODERM) 5 % Place 1 patch onto the skin daily. Remove & Discard patch within 12 hours or as directed by MD Patient not taking: Reported on 10/08/2018 12/13/17   Enid Derry, PA-C  oxyCODONE-acetaminophen (PERCOCET) 5-325 MG tablet Take 1 tablet by mouth every 6 (six) hours as needed for severe pain. Patient not taking: Reported on 10/08/2018 12/13/17 12/13/18  Enid Derry, PA-C  predniSONE (DELTASONE) 10 MG tablet Take 6 tablets on day 1, take 5 tablets on day 2, take 4 tablets on day 3, take 3 tablets on day 4, take 2 tablets on day 5, take 1 tablet on day 6 Patient not taking: Reported on 10/08/2018 12/13/17   Enid Derry, PA-C    REVIEW OF SYSTEMS:  Review of Systems  Constitutional: Negative for chills, fever, malaise/fatigue and weight loss.  HENT: Negative for ear pain, hearing loss and tinnitus.   Eyes: Negative for blurred vision, double vision, pain and redness.  Respiratory: Positive for shortness of breath. Negative for cough and hemoptysis.  Cardiovascular: Positive for chest pain. Negative for palpitations, orthopnea and leg swelling.  Gastrointestinal: Negative for abdominal pain, constipation, diarrhea, nausea and vomiting.  Genitourinary: Negative for dysuria, frequency and hematuria.  Musculoskeletal: Negative for back pain, joint pain and neck pain.  Skin:       No acne, rash, or lesions  Neurological: Negative for dizziness, tremors, focal weakness and weakness.  Endo/Heme/Allergies: Negative for polydipsia. Does not bruise/bleed easily.  Psychiatric/Behavioral: Negative for depression. The patient is  not nervous/anxious and does not have insomnia.      VITAL SIGNS:   Vitals:   10/08/18 1722 10/08/18 1917 10/08/18 2003 10/08/18 2020  BP: 140/89 (!) 152/99 (!) 148/89 (!) 127/95  Pulse: 99 70 78 71  Resp: 16 (!) 23 18 18   Temp: 98.4 F (36.9 C)     TempSrc: Oral     SpO2: 100% 99% 99% 100%  Weight: 104.3 kg     Height: 5\' 10"  (1.778 m)      Wt Readings from Last 3 Encounters:  10/08/18 104.3 kg  12/13/17 97.5 kg    PHYSICAL EXAMINATION:  Physical Exam  Vitals reviewed. Constitutional: He is oriented to person, place, and time. He appears well-developed and well-nourished. No distress.  HENT:  Head: Normocephalic and atraumatic.  Mouth/Throat: Oropharynx is clear and moist.  Eyes: Pupils are equal, round, and reactive to light. Conjunctivae and EOM are normal. No scleral icterus.  Neck: Normal range of motion. Neck supple. No JVD present. No thyromegaly present.  Cardiovascular: Normal rate, regular rhythm and intact distal pulses. Exam reveals no gallop and no friction rub.  No murmur heard. Respiratory: Effort normal and breath sounds normal. No respiratory distress. He has no wheezes. He has no rales.  GI: Soft. Bowel sounds are normal. He exhibits no distension. There is no abdominal tenderness.  Musculoskeletal: Normal range of motion.        General: No edema.     Comments: No arthritis, no gout  Lymphadenopathy:    He has no cervical adenopathy.  Neurological: He is alert and oriented to person, place, and time. No cranial nerve deficit.  No dysarthria, no aphasia  Skin: Skin is warm and dry. No rash noted. No erythema.  Psychiatric: He has a normal mood and affect. His behavior is normal. Judgment and thought content normal.    LABORATORY PANEL:   CBC Recent Labs  Lab 10/08/18 1724  WBC 16.4*  HGB 14.6  HCT 43.5  PLT 368   ------------------------------------------------------------------------------------------------------------------  Chemistries   Recent Labs  Lab 10/08/18 1724  NA 139  K 3.9  CL 104  CO2 22  GLUCOSE 106*  BUN 19  CREATININE 0.90  CALCIUM 9.4   ------------------------------------------------------------------------------------------------------------------  Cardiac Enzymes No results for input(s): TROPONINI in the last 168 hours. ------------------------------------------------------------------------------------------------------------------  RADIOLOGY:  Dg Chest 2 View  Result Date: 10/08/2018 CLINICAL DATA:  Chest pain, epigastric pain, cramping EXAM: CHEST - 2 VIEW COMPARISON:  None FINDINGS: Upper normal heart size. Mediastinal contours and pulmonary vascularity normal. Nodular density LEFT mid lung 18 x 15 x 15 mm. Remaining lungs clear. No pleural effusion or pneumothorax. No acute osseous findings. IMPRESSION: LEFT mid lung nodule 18 x 15 x 15 mm, question in superior segment of the LEFT lower lobe; CT chest recommended to evaluate. Findings called to Dr. Erma HeritageIsaacs on 10/08/2018 at 1803 hours. Electronically Signed   By: Ulyses SouthwardMark  Boles M.D.   On: 10/08/2018 18:05   Ct Angio Chest Pe W And/or Wo Contrast  Result Date: 10/08/2018 CLINICAL DATA:  Chest pain slightly positional EXAM: CT ANGIOGRAPHY CHEST WITH CONTRAST TECHNIQUE: Multidetector CT imaging of the chest was performed using the standard protocol during bolus administration of intravenous contrast. Multiplanar CT image reconstructions and MIPs were obtained to evaluate the vascular anatomy. CONTRAST:  100mL OMNIPAQUE IOHEXOL 350 MG/ML SOLN COMPARISON:  Chest radiograph same day FINDINGS: Cardiovascular: There is a optimal opacification of the pulmonary arteries. There is no central,segmental, or subsegmental filling defects within the pulmonary arteries. The heart is normal in size. No pericardial effusion thickening. No evidence right heart strain. There is normal three-vessel brachiocephalic anatomy without proximal stenosis. The thoracic aorta is normal  in appearance. Mediastinum/Nodes: No hilar, mediastinal, or axillary adenopathy. Thyroid gland, trachea, and esophagus demonstrate no significant findings. Lungs/Pleura: There is a solid 1.7 cm pulmonary nodule seen in the posterior left upper lobe adjacent to pulmonary venous vasculature. The remainder of the lungs are clear. No pleural effusion. Upper Abdomen: No acute abnormalities present in the visualized portions of the upper abdomen. Musculoskeletal: No chest wall abnormality. No acute or significant osseous findings. Review of the MIP images confirms the above findings. IMPRESSION: 1. No acute central, segmental, or subsegmental pulmonary embolism. 2. 1.7 cm solid pulmonary nodule in the posterior left upper lobe. Consider one of the following in 3 months for both low-risk and high-risk individuals: (a) repeat chest CT, (b) follow-up PET-CT, or (c) tissue sampling. This recommendation follows the consensus statement: Guidelines for Management of Incidental Pulmonary Nodules Detected on CT Images: From the Fleischner Society 2017; Radiology 2017; 284:228-243. Electronically Signed   By: Jonna ClarkBindu  Avutu M.D.   On: 10/08/2018 20:12    EKG:   Orders placed or performed during the hospital encounter of 10/08/18  . ED EKG  . ED EKG    IMPRESSION AND PLAN:  Principal Problem:   NSTEMI (non-ST elevated myocardial infarction) (HCC) -troponin elevated and rising, EKG changes present.  Patient is on IV heparin and IV nitro.  He initially had some improvement with regards to his chest pain after being started on the nitro drip, though his pain then increased again requiring further titration.  He had some dynamic changes from his first to his second EKG, with T wave inversion appearing in aVL.  A third EKG showed further flattening of his T waves in V1 and V2.  We will check another EKG at midnight and notify cardiology if he has further changes, or if his pain increases significantly.  Otherwise plan is for  cardiac cath in the morning.  Chart review performed and case discussed with ED provider. Labs, imaging and/or ECG reviewed by provider and discussed with patient/family. Management plans discussed with the patient and/or family.  COVID-19 status: Tested negative     DVT PROPHYLAXIS: Systemic anticoagulation  GI PROPHYLAXIS:  None  ADMISSION STATUS: Inpatient     CODE STATUS: Full  TOTAL TIME TAKING CARE OF THIS PATIENT: 45 minutes.   This patient was evaluated in the context of the global COVID-19 pandemic, which necessitated consideration that the patient might be at risk for infection with the SARS-CoV-2 virus that causes COVID-19. Institutional protocols and algorithms that pertain to the evaluation of patients at risk for COVID-19 are in a state of rapid change based on information released by regulatory bodies including the CDC and federal and state organizations. These policies and algorithms were followed to the best of this provider's knowledge to date during the patient's care at this facility.  Candace Cruiseavid F  Anamari Galeas 10/08/2018, 9:26 PM  [employer]  [phone]  CC: Primary care physician; Patient, No Pcp Per  Note:  This document was prepared using Dragon voice recognition software and may include unintentional dictation errors.

## 2018-10-08 NOTE — ED Notes (Signed)
Elevated troponin level reviewed with MD Paduchowski

## 2018-10-08 NOTE — ED Notes (Signed)
.. ED TO INPATIENT HANDOFF REPORT  ED Nurse Name and Phone #: Deneise Lever [phone]  S Name/Age/Gender Jim Medina 33 y.o. male Room/Bed: ED01A/ED01A  Code Status   Code Status: Not on file  Home/SNF/Other Home Patient oriented to: self, place, time and situation Is this baseline? Yes   Triage Complete: Triage complete  Chief Complaint Chest  Triage Note Epigastric and chest pain, constant but varies in intensity. Cramping. No hx of similar. Pt alert and oriented X4, cooperative, RR even and unlabored, color WNL. Pt in NAD.    Allergies No Known Allergies  Level of Care/Admitting Diagnosis ED Disposition    ED Disposition Condition Comment   Admit  Hospital Area: [place] [[id number]]  Level of Care: Telemetry [5]  Covid Evaluation: Confirmed COVID Negative  Diagnosis: NSTEMI (non-ST elevated myocardial infarction) [facility]) [[id number]]  Admitting Physician: Lance Coon [[id number]]  Attending Physician: Lance Coon [phone]  Estimated length of stay: past midnight tomorrow  Certification:: I certify this patient will need inpatient services for at least 2 midnights  Bed request comments: 2a  PT Class (Do Not Modify): Inpatient [101]  PT Acc Code (Do Not Modify): Private [1]       B Medical/Surgery History Past Medical History:  Diagnosis Date  . Tobacco use    Past Surgical History:  Procedure Laterality Date  . NO PAST SURGERIES       A IV Location/Drains/Wounds Patient Lines/Drains/Airways Status   Active Line/Drains/Airways    Name:   Placement date:   Placement time:   Site:   Days:   Peripheral IV 10/08/18 Right Hand   10/08/18    1935    Hand   less than 1   Peripheral IV 10/08/18 Right Antecubital   10/08/18    1935    Antecubital   less than 1          Intake/Output Last 24 hours No intake or output data in the 24 hours ending 10/08/18 2156  Labs/Imaging Results for orders placed or performed during the hospital encounter of  10/08/18 (from the past 48 hour(s))  Basic metabolic panel     Status: Abnormal   Collection Time: 10/08/18  5:24 PM  Result Value Ref Range   Sodium 139 135 - 145 mmol/L   Potassium 3.9 3.5 - 5.1 mmol/L   Chloride 104 98 - 111 mmol/L   CO2 22 22 - 32 mmol/L   Glucose, Bld 106 (H) 70 - 99 mg/dL   BUN 19 6 - 20 mg/dL   Creatinine, Ser 0.90 0.61 - 1.24 mg/dL   Calcium 9.4 8.9 - 10.3 mg/dL   GFR calc non Af Amer >60 >60 mL/min   GFR calc Af Amer >60 >60 mL/min   Anion gap 13 5 - 15    Comment: Performed at [facility], [address] [place], [place] [address]  CBC     Status: Abnormal   Collection Time: 10/08/18  5:24 PM  Result Value Ref Range   WBC 16.4 (H) 4.0 - 10.5 K/uL   RBC 5.22 4.22 - 5.81 MIL/uL   Hemoglobin 14.6 13.0 - 17.0 g/dL   HCT 43.5 39.0 - 52.0 %   MCV 83.3 80.0 - 100.0 fL   MCH 28.0 26.0 - 34.0 pg   MCHC 33.6 30.0 - 36.0 g/dL   RDW 13.5 11.5 - 15.5 %   Platelets 368 150 - 400 K/uL   nRBC 0.0 0.0 - 0.2 %    Comment:  Performed at [facility], [address] [place], [place] [address]  Troponin I (High Sensitivity)     Status: Abnormal   Collection Time: 10/08/18  5:24 PM  Result Value Ref Range   Troponin I (High Sensitivity) 1,552 (HH) <18 ng/L    Comment: CRITICAL RESULT CALLED TO, READ BACK BY AND VERIFIED WITH KAILEY WALKER @1802  10/08/18 MJU (NOTE) Elevated high sensitivity troponin I (hsTnI) values and significant  changes across serial measurements may suggest ACS but many other  chronic and acute conditions are known to elevate hsTnI results.  Refer to the "Links" section for chest pain algorithms and additional  guidance. Performed at [facility], [address] [place], [place] [address]   Troponin I (High Sensitivity)     Status: Abnormal   Collection Time: 10/08/18  7:13 PM  Result Value Ref Range   Troponin I (High Sensitivity) 4,199 (HH) <18 ng/L    Comment: CRITICAL RESULT CALLED TO, READ BACK BY AND  VERIFIED WITH Monti Jilek @1957  10/08/18 MJU (NOTE) Elevated high sensitivity troponin I (hsTnI) values and significant  changes across serial measurements may suggest ACS but many other  chronic and acute conditions are known to elevate hsTnI results.  Refer to the "Links" section for chest pain algorithms and additional  guidance. Performed at [facility], [address] [place], [place] [address]   SARS Coronavirus 2 [facility] order, Performed in [facility] hospital lab) Nasopharyngeal Nasopharyngeal Swab     Status: None   Collection Time: 10/08/18  7:31 PM   Specimen: Nasopharyngeal Swab  Result Value Ref Range   SARS Coronavirus 2 NEGATIVE NEGATIVE    Comment: (NOTE) If result is NEGATIVE SARS-CoV-2 target nucleic acids are NOT DETECTED. The SARS-CoV-2 RNA is generally detectable in upper and lower  respiratory specimens during the acute phase of infection. The lowest  concentration of SARS-CoV-2 viral copies this assay can detect is 250  copies / mL. A negative result does not preclude SARS-CoV-2 infection  and should not be used as the sole basis for treatment or other  patient management decisions.  A negative result may occur with  improper specimen collection / handling, submission of specimen other  than nasopharyngeal swab, presence of viral mutation(s) within the  areas targeted by this assay, and inadequate number of viral copies  (<250 copies / mL). A negative result must be combined with clinical  observations, patient history, and epidemiological information. If result is POSITIVE SARS-CoV-2 target nucleic acids are DETECTED. The SARS-CoV-2 RNA is generally detectable in upper and lower  respiratory specimens dur ing the acute phase of infection.  Positive  results are indicative of active infection with SARS-CoV-2.  Clinical  correlation with patient history and other diagnostic information is  necessary to determine patient infection status.   Positive results do  not rule out bacterial infection or co-infection with other viruses. If result is PRESUMPTIVE POSTIVE SARS-CoV-2 nucleic acids MAY BE PRESENT.   A presumptive positive result was obtained on the submitted specimen  and confirmed on repeat testing.  While 2019 novel coronavirus  (SARS-CoV-2) nucleic acids may be present in the submitted sample  additional confirmatory testing may be necessary for epidemiological  and / or clinical management purposes  to differentiate between  SARS-CoV-2 and other Sarbecovirus currently known to infect humans.  If clinically indicated additional testing with an alternate test  methodology [id number]) is advised. The SARS-CoV-2 RNA is generally  detectable in upper and lower respiratory sp  ecimens during the acute  phase of infection. The expected result is Negative. Fact Sheet for Patients:  BoilerBrush.com.cyhttps://www.fda.gov/media/136312/download Fact Sheet for Healthcare Providers: https://pope.com/https://www.fda.gov/media/136313/download This test is not yet approved or cleared by the [place] FDA and has been authorized for detection and/or diagnosis of SARS-CoV-2 by FDA under an Emergency Use Authorization (EUA).  This EUA will remain in effect (meaning this test can be used) for the duration of the COVID-19 declaration under Section 564(b)(1) of the Act, 21 U.S.C. section 360bbb-3(b)(1), unless the authorization is terminated or revoked sooner. Performed at [facility], [address] [place], [place] [address]   APTT     Status: None   Collection Time: 10/08/18  7:31 PM  Result Value Ref Range   aPTT 35 24 - 36 seconds    Comment: Performed at [facility], [address] [place], [place] [address]  Protime-INR     Status: None   Collection Time: 10/08/18  7:31 PM  Result Value Ref Range   Prothrombin Time 13.4 11.4 - 15.2 seconds   INR 1.0 0.8 - 1.2    Comment: (NOTE) INR goal varies based on device and disease  states. Performed at [facility], [address] [place], [place] [address]    Dg Chest 2 View  Result Date: 10/08/2018 CLINICAL DATA:  Chest pain, epigastric pain, cramping EXAM: CHEST - 2 VIEW COMPARISON:  None FINDINGS: Upper normal heart size. Mediastinal contours and pulmonary vascularity normal. Nodular density LEFT mid lung 18 x 15 x 15 mm. Remaining lungs clear. No pleural effusion or pneumothorax. No acute osseous findings. IMPRESSION: LEFT mid lung nodule 18 x 15 x 15 mm, question in superior segment of the LEFT lower lobe; CT chest recommended to evaluate. Findings called to Dr. Erma HeritageIsaacs on 10/08/2018 at 1803 hours. Electronically Signed   By: Ulyses SouthwardMark  Boles M.D.   On: 10/08/2018 18:05   Ct Angio Chest Pe W And/or Wo Contrast  Result Date: 10/08/2018 CLINICAL DATA:  Chest pain slightly positional EXAM: CT ANGIOGRAPHY CHEST WITH CONTRAST TECHNIQUE: Multidetector CT imaging of the chest was performed using the standard protocol during bolus administration of intravenous contrast. Multiplanar CT image reconstructions and MIPs were obtained to evaluate the vascular anatomy. CONTRAST:  100mL OMNIPAQUE IOHEXOL 350 MG/ML SOLN COMPARISON:  Chest radiograph same day FINDINGS: Cardiovascular: There is a optimal opacification of the pulmonary arteries. There is no central,segmental, or subsegmental filling defects within the pulmonary arteries. The heart is normal in size. No pericardial effusion thickening. No evidence right heart strain. There is normal three-vessel brachiocephalic anatomy without proximal stenosis. The thoracic aorta is normal in appearance. Mediastinum/Nodes: No hilar, mediastinal, or axillary adenopathy. Thyroid gland, trachea, and esophagus demonstrate no significant findings. Lungs/Pleura: There is a solid 1.7 cm pulmonary nodule seen in the posterior left upper lobe adjacent to pulmonary venous vasculature. The remainder of the lungs are clear. No pleural effusion. Upper  Abdomen: No acute abnormalities present in the visualized portions of the upper abdomen. Musculoskeletal: No chest wall abnormality. No acute or significant osseous findings. Review of the MIP images confirms the above findings. IMPRESSION: 1. No acute central, segmental, or subsegmental pulmonary embolism. 2. 1.7 cm solid pulmonary nodule in the posterior left upper lobe. Consider one of the following in 3 months for both low-risk and high-risk individuals: (a) repeat chest CT, (b) follow-up PET-CT, or (c) tissue sampling. This recommendation follows the consensus statement: Guidelines for Management of Incidental Pulmonary Nodules Detected on CT Images: From  the Fleischner Society 2017; Radiology 2017; [phone]. Electronically Signed   By: Jonna ClarkBindu  Avutu M.D.   On: 10/08/2018 20:12    Pending Labs Unresulted Labs (From admission, onward)    Start     Ordered   10/09/18 0500  CBC  Tomorrow morning,   STAT     10/08/18 2020   10/09/18 0200  Heparin level (unfractionated)  Once-Timed,   STAT     10/08/18 2020   10/08/18 1919  Urine Drug Screen, Qualitative ([facility] only)  Once,   STAT     10/08/18 1919   Signed and Held  HIV antibody (Routine Testing)  Once,   R     Signed and Held   Signed and Held  Basic metabolic panel  Tomorrow morning,   R     Signed and Held   Signed and Held  CBC  Tomorrow morning,   R     Signed and Held          Vitals/Pain Today's Vitals   10/08/18 2130 10/08/18 2145 10/08/18 2155 10/08/18 2156  BP: (!) 142/93 133/88    Pulse: 71 60    Resp: 20 16    Temp:      TempSrc:      SpO2: 97% 98%    Weight:      Height:      PainSc:   5  5     Isolation Precautions Airborne and Contact precautions  Medications Medications  nitroGLYCERIN (NITROSTAT) SL tablet 0.4 mg (0.4 mg Sublingual Given 10/08/18 2005)  nitroGLYCERIN (NITROSTAT) 0.4 MG SL tablet (has no administration in time range)  heparin ADULT infusion 100 units/mL (25000 units/26150mL sodium chloride  0.45%) (1,250 Units/hr Intravenous New Bag/Given 10/08/18 2009)  nitroGLYCERIN 50 mg in dextrose 5 % 250 mL (0.2 mg/mL) infusion (10 mcg/min Intravenous New Bag/Given 10/08/18 2154)  aspirin chewable tablet 324 mg (324 mg Oral Given 10/08/18 1930)  heparin bolus via infusion 4,000 Units (4,000 Units Intravenous Bolus from Bag 10/08/18 2009)  iohexol (OMNIPAQUE) 350 MG/ML injection 100 mL (100 mLs Intravenous Contrast Given 10/08/18 1950)  morphine 4 MG/ML injection 4 mg (4 mg Intravenous Given 10/08/18 2046)    Mobility walks Low fall risk   Focused Assessments Cardiac Assessment Handoff:  Cardiac Rhythm: Normal sinus rhythm No results found for: CKTOTAL, CKMB, CKMBINDEX, TROPONINI No results found for: DDIMER Does the Patient currently have chest pain? Yes      R Recommendations: See Admitting Provider Note  Report given to:   Additional Notes:

## 2018-10-08 NOTE — ED Triage Notes (Signed)
Epigastric and chest pain, constant but varies in intensity. Cramping. No hx of similar. Pt alert and oriented X4, cooperative, RR even and unlabored, color WNL. Pt in NAD.

## 2018-10-09 ENCOUNTER — Inpatient Hospital Stay: Admit: 2018-10-09 | Payer: 59

## 2018-10-09 ENCOUNTER — Encounter
Admission: EM | Disposition: A | Discharge status: Home / Self Care | Payer: Self-pay | Source: Home / Self Care | Attending: Internal Medicine

## 2018-10-09 DIAGNOSIS — E785 Hyperlipidemia, unspecified: Secondary | ICD-10-CM

## 2018-10-09 DIAGNOSIS — I214 Non-ST elevation (NSTEMI) myocardial infarction: Principal | ICD-10-CM

## 2018-10-09 DIAGNOSIS — E782 Mixed hyperlipidemia: Secondary | ICD-10-CM

## 2018-10-09 DIAGNOSIS — I200 Unstable angina: Secondary | ICD-10-CM

## 2018-10-09 DIAGNOSIS — R911 Solitary pulmonary nodule: Secondary | ICD-10-CM

## 2018-10-09 HISTORY — PX: LEFT HEART CATH AND CORONARY ANGIOGRAPHY: CATH118249

## 2018-10-09 HISTORY — PX: CORONARY STENT INTERVENTION: CATH118234

## 2018-10-09 LAB — CBC
HCT: 39.1 % (ref 39.0–52.0)
Hemoglobin: 13 g/dL (ref 13.0–17.0)
MCH: 27.7 pg (ref 26.0–34.0)
MCHC: 33.2 g/dL (ref 30.0–36.0)
MCV: 83.2 fL (ref 80.0–100.0)
Platelets: 311 10*3/uL (ref 150–400)
RBC: 4.7 MIL/uL (ref 4.22–5.81)
RDW: 13.6 % (ref 11.5–15.5)
WBC: 12.2 10*3/uL — ABNORMAL HIGH (ref 4.0–10.5)
nRBC: 0 % (ref 0.0–0.2)

## 2018-10-09 LAB — GLUCOSE, CAPILLARY: Glucose-Capillary: 144 mg/dL — ABNORMAL HIGH (ref 70–99)

## 2018-10-09 LAB — LIPID PANEL
Cholesterol: 199 mg/dL (ref 0–200)
HDL: 32 mg/dL — ABNORMAL LOW (ref >40)
LDL Cholesterol: UNDETERMINED mg/dL (ref 0–99)
Total CHOL/HDL Ratio: 6.2 RATIO
Triglycerides: 423 mg/dL — ABNORMAL HIGH (ref <150)
VLDL: UNDETERMINED mg/dL (ref 0–40)

## 2018-10-09 LAB — COMPREHENSIVE METABOLIC PANEL
ALT: 47 U/L — ABNORMAL HIGH (ref 0–44)
AST: 82 U/L — ABNORMAL HIGH (ref 15–41)
Albumin: 4.3 g/dL (ref 3.5–5.0)
Alkaline Phosphatase: 50 U/L (ref 38–126)
Anion gap: 12 (ref 5–15)
BUN: 11 mg/dL (ref 6–20)
CO2: 26 mmol/L (ref 22–32)
Calcium: 9.3 mg/dL (ref 8.9–10.3)
Chloride: 100 mmol/L (ref 98–111)
Creatinine, Ser: 0.83 mg/dL (ref 0.61–1.24)
GFR calc Af Amer: >60 mL/min (ref >60)
GFR calc non Af Amer: >60 mL/min (ref >60)
Glucose, Bld: 97 mg/dL (ref 70–99)
Potassium: 3.5 mmol/L (ref 3.5–5.1)
Sodium: 138 mmol/L (ref 135–145)
Total Bilirubin: 1.4 mg/dL — ABNORMAL HIGH (ref 0.3–1.2)
Total Protein: 7.1 g/dL (ref 6.5–8.1)

## 2018-10-09 LAB — URINE DRUG SCREEN, QUALITATIVE (ARMC ONLY)
Amphetamines, Ur Screen: NOT DETECTED
Barbiturates, Ur Screen: NOT DETECTED
Benzodiazepine, Ur Scrn: NOT DETECTED
Cannabinoid 50 Ng, Ur ~~LOC~~: POSITIVE — AB
Cocaine Metabolite,Ur ~~LOC~~: NOT DETECTED
MDMA (Ecstasy)Ur Screen: NOT DETECTED
Methadone Scn, Ur: NOT DETECTED
Opiate, Ur Screen: POSITIVE — AB
Phencyclidine (PCP) Ur S: NOT DETECTED
Tricyclic, Ur Screen: NOT DETECTED

## 2018-10-09 LAB — BASIC METABOLIC PANEL
Anion gap: 9 (ref 5–15)
BUN: 15 mg/dL (ref 6–20)
CO2: 24 mmol/L (ref 22–32)
Calcium: 9 mg/dL (ref 8.9–10.3)
Chloride: 106 mmol/L (ref 98–111)
Creatinine, Ser: 0.75 mg/dL (ref 0.61–1.24)
GFR calc Af Amer: >60 mL/min (ref >60)
GFR calc non Af Amer: >60 mL/min (ref >60)
Glucose, Bld: 111 mg/dL — ABNORMAL HIGH (ref 70–99)
Potassium: 3.5 mmol/L (ref 3.5–5.1)
Sodium: 139 mmol/L (ref 135–145)

## 2018-10-09 LAB — TROPONIN I (HIGH SENSITIVITY)
Troponin I (High Sensitivity): 11669 ng/L (ref <18)
Troponin I (High Sensitivity): 12394 ng/L (ref <18)
Troponin I (High Sensitivity): 12457 ng/L (ref <18)
Troponin I (High Sensitivity): 12962 ng/L (ref <18)
Troponin I (High Sensitivity): 13052 ng/L (ref <18)
Troponin I (High Sensitivity): 8723 ng/L (ref <18)

## 2018-10-09 LAB — HEPARIN LEVEL (UNFRACTIONATED)
Heparin Unfractionated: <0.1 IU/mL — ABNORMAL LOW (ref 0.30–0.70)
Heparin Unfractionated: 0.2 IU/mL — ABNORMAL LOW (ref 0.30–0.70)
Heparin Unfractionated: <0.1 IU/mL — ABNORMAL LOW (ref 0.30–0.70)

## 2018-10-09 LAB — MAGNESIUM: Magnesium: 2.1 mg/dL (ref 1.7–2.4)

## 2018-10-09 LAB — POCT ACTIVATED CLOTTING TIME: Activated Clotting Time: 335 seconds

## 2018-10-09 LAB — MRSA PCR SCREENING: MRSA by PCR: NEGATIVE

## 2018-10-09 LAB — LDL CHOLESTEROL, DIRECT: Direct LDL: 115.2 mg/dL — ABNORMAL HIGH (ref 0–99)

## 2018-10-09 LAB — PHOSPHORUS: Phosphorus: 2.9 mg/dL (ref 2.5–4.6)

## 2018-10-09 SURGERY — LEFT HEART CATH AND CORONARY ANGIOGRAPHY
Anesthesia: Moderate Sedation

## 2018-10-09 MED ORDER — IOHEXOL 300 MG/ML  SOLN
INTRAMUSCULAR | Status: DC | PRN
  Administered 2018-10-09: 235 mL via INTRA_ARTERIAL

## 2018-10-09 MED ORDER — ASPIRIN 81 MG PO CHEW
CHEWABLE_TABLET | ORAL | Status: DC | PRN
  Administered 2018-10-09: 324 mg via ORAL

## 2018-10-09 MED ORDER — BIVALIRUDIN BOLUS VIA INFUSION - CUPID
INTRAVENOUS | Status: DC | PRN
  Administered 2018-10-09: 77.25 mg via INTRAVENOUS

## 2018-10-09 MED ORDER — BIVALIRUDIN TRIFLUOROACETATE 250 MG IV SOLR
INTRAVENOUS | Status: AC
  Filled 2018-10-09: qty 250

## 2018-10-09 MED ORDER — MIDAZOLAM HCL 2 MG/2ML IJ SOLN
INTRAMUSCULAR | Status: AC
  Filled 2018-10-09: qty 2

## 2018-10-09 MED ORDER — HEPARIN BOLUS VIA INFUSION
1400.0000 [IU] | Freq: Once | INTRAVENOUS | Status: AC
  Administered 2018-10-09: 1400 [IU] via INTRAVENOUS
  Filled 2018-10-09: qty 1400

## 2018-10-09 MED ORDER — HEPARIN (PORCINE) IN NACL 2000-0.9 UNIT/L-% IV SOLN
INTRAVENOUS | Status: DC | PRN
  Administered 2018-10-09: 500 mL

## 2018-10-09 MED ORDER — FENTANYL CITRATE (PF) 100 MCG/2ML IJ SOLN
INTRAMUSCULAR | Status: DC | PRN
  Administered 2018-10-09: 25 ug via INTRAVENOUS
  Administered 2018-10-09: 50 ug via INTRAVENOUS

## 2018-10-09 MED ORDER — MIDAZOLAM HCL 2 MG/2ML IJ SOLN
INTRAMUSCULAR | Status: DC | PRN
  Administered 2018-10-09 (×2): 1 mg via INTRAVENOUS

## 2018-10-09 MED ORDER — TICAGRELOR 90 MG PO TABS
ORAL_TABLET | ORAL | Status: DC | PRN
  Administered 2018-10-09: 180 mg via ORAL

## 2018-10-09 MED ORDER — HEPARIN BOLUS VIA INFUSION
2800.0000 [IU] | Freq: Once | INTRAVENOUS | Status: AC
  Administered 2018-10-09: 2800 [IU] via INTRAVENOUS
  Filled 2018-10-09: qty 2800

## 2018-10-09 MED ORDER — ALPRAZOLAM 0.5 MG PO TABS
0.5000 mg | ORAL_TABLET | Freq: Two times a day (BID) | ORAL | Status: DC | PRN
  Administered 2018-10-09: 0.5 mg via ORAL
  Filled 2018-10-09: qty 1

## 2018-10-09 MED ORDER — FENTANYL CITRATE (PF) 100 MCG/2ML IJ SOLN
INTRAMUSCULAR | Status: AC
  Filled 2018-10-09: qty 2

## 2018-10-09 MED ORDER — ACETAMINOPHEN 325 MG PO TABS: 650.0000 mg | ORAL_TABLET | ORAL | Status: DC | PRN

## 2018-10-09 MED ORDER — ASPIRIN 81 MG PO CHEW
CHEWABLE_TABLET | ORAL | Status: AC
  Filled 2018-10-09: qty 4

## 2018-10-09 MED ORDER — ONDANSETRON HCL 4 MG/2ML IJ SOLN: 4.0000 mg | Freq: Four times a day (QID) | INTRAMUSCULAR | Status: DC | PRN

## 2018-10-09 MED ORDER — NITROGLYCERIN 1 MG/10 ML FOR IR/CATH LAB
INTRA_ARTERIAL | Status: AC
  Filled 2018-10-09: qty 10

## 2018-10-09 MED ORDER — SODIUM CHLORIDE 0.9 % IV SOLN
0.2500 mg/kg/h | INTRAVENOUS | Status: AC
  Filled 2018-10-09: qty 250

## 2018-10-09 MED ORDER — TICAGRELOR 90 MG PO TABS
ORAL_TABLET | ORAL | Status: AC
  Filled 2018-10-09: qty 2

## 2018-10-09 MED ORDER — ZOLPIDEM TARTRATE 5 MG PO TABS: 5.0000 mg | ORAL_TABLET | Freq: Every evening | ORAL | Status: DC | PRN

## 2018-10-09 MED ORDER — ALPRAZOLAM 0.25 MG PO TABS: 0.2500 mg | ORAL_TABLET | Freq: Two times a day (BID) | ORAL | Status: DC | PRN

## 2018-10-09 MED ORDER — HEPARIN (PORCINE) IN NACL 1000-0.9 UT/500ML-% IV SOLN
INTRAVENOUS | Status: AC
  Filled 2018-10-09: qty 1000

## 2018-10-09 MED ORDER — SODIUM CHLORIDE 0.9 % IV SOLN
INTRAVENOUS | Status: AC | PRN
  Administered 2018-10-09 (×2): 1.75 mg/kg/h via INTRAVENOUS

## 2018-10-09 MED ORDER — CHLORHEXIDINE GLUCONATE CLOTH 2 % EX PADS: 6.0000 | MEDICATED_PAD | Freq: Every day | CUTANEOUS | Status: DC

## 2018-10-09 SURGICAL SUPPLY — 16 items
BALLN TREK RX 2.5X15 (BALLOONS) ×3
BALLOON TREK RX 2.5X15 (BALLOONS) ×1 IMPLANT
CATH INFINITI 5 FR 3DRC (CATHETERS) ×3 IMPLANT
CATH INFINITI 5FR ANG PIGTAIL (CATHETERS) ×3 IMPLANT
CATH INFINITI 5FR JL4 (CATHETERS) ×3 IMPLANT
CATH INFINITI JR4 5F (CATHETERS) ×3 IMPLANT
CATH VISTA GUIDE 6FR XB3.5 (CATHETERS) ×3 IMPLANT
DEVICE CLOSURE MYNXGRIP 6/7F (Vascular Products) ×3 IMPLANT
DEVICE INFLAT 30 PLUS (MISCELLANEOUS) ×3 IMPLANT
KIT MANI 3VAL PERCEP (MISCELLANEOUS) ×3 IMPLANT
NEEDLE PERC 18GX7CM (NEEDLE) ×3 IMPLANT
PACK CARDIAC CATH (CUSTOM PROCEDURE TRAY) ×3 IMPLANT
SHEATH AVANTI 6FR X 11CM (SHEATH) ×3 IMPLANT
STENT RESOLUTE ONYX 3.0X15 (Permanent Stent) ×3 IMPLANT
WIRE G HI TQ BMW 190 (WIRE) ×3 IMPLANT
WIRE GUIDERIGHT .035X150 (WIRE) ×3 IMPLANT

## 2018-10-09 NOTE — Progress Notes (Signed)
[place] for Heparin Drip  Indication: chest pain/ACS  No Known Allergies  Patient Measurements: Height: 5\' 10"  (177.8 cm) Weight: 229 lb 9.6 oz (104.1 kg) IBW/kg (Calculated) : 73 Heparin Dosing Weight: 95.2 kg  Vital Signs: Temp: 97.7 F (36.5 C) (09/04 2315) Temp Source: Oral (09/04 2315) BP: 136/85 (09/04 2331) Pulse Rate: 53 (09/04 2331)  Labs: Recent Labs    10/08/18 1724 10/08/18 1913 10/08/18 1931 10/08/18 2227 10/08/18 2333 10/09/18 0225  HGB 14.6  --   --   --   --  13.0  HCT 43.5  --   --   --   --  39.1  PLT 368  --   --   --   --  311  APTT  --   --  35  --   --   --   LABPROT  --   --  13.4  --   --   --   INR  --   --  1.0  --   --   --   HEPARINUNFRC  --   --   --   --   --  <0.10*  CREATININE 0.90  --   --   --   --  0.75  TROPONINIHS 1,552* 4,199*  --  7,178* 8,723*  --     Estimated Creatinine Clearance: 161.6 mL/min (by C-G formula based on SCr of 0.75 mg/dL).   Medical History: Past Medical History:  Diagnosis Date  . Tobacco use     Assessment: Patient is a 33yo male admitted with chest pain and elevated troponin. Pharmacy consulted for Heparin dosing.  9/5 @ 0225 HL = < 0.10 at rate of 1250 units/hr, confirmed w/ RN no problems w/ infusion  Goal of Therapy:  Heparin level 0.3-0.7 units/ml Monitor platelets by anticoagulation protocol: Yes   Plan:  Rebolus w/ Heparin 2800 units x 1  Increase heparin rate to 1550 units/hr Recheck heparin level 6 hours after rate increase  Hart Robinsons, PharmD 10/09/2018 3:49 AM

## 2018-10-09 NOTE — Progress Notes (Addendum)
MD [name] consulted concerning patients HR and NTG gtt maintenance. Patients HR ranging from 55-70, noted occurrence when HR dropped to 49. Vitals otherwise stable. Patient c/o of 3/4 "annoying" chest tightness. Per MD, obtain another EKG. Advisement concerning NTG gtt to follow.   Update: EKG completed. MD [name] made aware, Per MD EKG shows improvement therefore continue to monitor patient at this time. Okay to continue with titration of NTG gtt. Patient states that his pain is much improved and would not like to have gtt increased. Will continue to monitor.   Update: Patient c/o of nausea and vomiting, diaphoretic, headache, reports not feeling well overall. VSS. EKG unchanged from previous studies. Zofran and morphine given. MD Marcille Blanco notified. Per MD, he will get in contact with on call cardiologist for further consultation. Will continue to closely monitor patient.   Jim Medina M

## 2018-10-09 NOTE — Progress Notes (Addendum)
Report given, patient going down to cath lab. Consent signed, Naoma Diener transporting patient down.  PRN antianxiety med given.   Update 1415: patient being transferred to step down from cath lab.Report given to [place]. Will take belongings to [place].

## 2018-10-09 NOTE — Plan of Care (Signed)
  Problem: Clinical Measurements: Goal: Ability to maintain clinical measurements within normal limits will improve Outcome: Progressing   Problem: Pain Managment: Goal: General experience of comfort will improve Outcome: Progressing   Problem: Safety: Goal: Ability to remain free from injury will improve Outcome: Progressing   Problem: Activity: Goal: Ability to tolerate increased activity will improve Outcome: Progressing   Problem: Cardiac: Goal: Ability to achieve and maintain adequate cardiovascular perfusion will improve Outcome: Progressing   Problem: Health Behavior/Discharge Planning: Goal: Ability to safely manage health-related needs after discharge will improve Outcome: Progressing

## 2018-10-09 NOTE — Progress Notes (Addendum)
Cardiology Consultation:   Patient ID: Jim Medina MRN: [id number]; DOB: 19-Jul-1985  Admit date: 10/08/2018 Date of Consult: 10/09/2018  Primary Care Provider: Patient, No Pcp Per Primary Cardiologist: New to [facility] Physician requesting consultation: Dr. [name] Reason for consultation: NSTEMI   Patient Profile:   Jim Medina is a 33 y.o. male with a hx of smoking with marijuana, hypertriglyceridemia, presenting with chest pain/unstable angina, non-STEMI  History of Present Illness:   Jim Medina reports that he has been asymptomatic until yesterday morning when he woke up, did not feel right, through the course of the day developed worsening chest and upper epigastric discomfort.  Symptoms became severe after going to work late afternoon and presented to the emergency room.   Initial EKG unimpressive First high-sensitivity troponin 1500 Started on heparin infusion Secondary to continued chest pain, started on nitro infusion Repeat troponin 4100, 7100, 8700, over 12,000 Pain seemed to improve down to 4/10 late evening overnight 4 AM with severe diaphoresis, malaise, vomiting, headache, Repeat EKG concerning for ST abnormality anterior leads V1 through V3, T wave inversions, also with T wave abnormality 1 and aVL concerning for ischemia Morphine nitro given pain down to 4/10 This morning feels like he has a cramp in his chest 3/10 Headache  Recreational marijuana use but no regular smoking Does not see doctors on a regular basis Denies that high cholesterol her blood pressure has been an issue Does not know his family history, brought by his grandparents   Heart Pathway Score:     Past Medical History:  Diagnosis Date  . Tobacco use     Past Surgical History:  Procedure Laterality Date  . NO PAST SURGERIES       Home Medications:  Prior to Admission medications   Medication Sig Start Date End Date Taking? Authorizing Provider  cyclobenzaprine (FLEXERIL) 5 MG tablet Take  1-2 tablets 3 times daily as needed Patient not taking: Reported on 10/08/2018 12/13/17   Laban Emperor, PA-C  ketorolac (TORADOL) 10 MG tablet Take 1 tablet (10 mg total) by mouth every 6 (six) hours as needed. Patient not taking: Reported on 10/08/2018 12/13/17   Laban Emperor, PA-C  lidocaine (LIDODERM) 5 % Place 1 patch onto the skin daily. Remove & Discard patch within 12 hours or as directed by MD Patient not taking: Reported on 10/08/2018 12/13/17   Laban Emperor, PA-C  oxyCODONE-acetaminophen (PERCOCET) 5-325 MG tablet Take 1 tablet by mouth every 6 (six) hours as needed for severe pain. Patient not taking: Reported on 10/08/2018 12/13/17 12/13/18  Laban Emperor, PA-C  predniSONE (DELTASONE) 10 MG tablet Take 6 tablets on day 1, take 5 tablets on day 2, take 4 tablets on day 3, take 3 tablets on day 4, take 2 tablets on day 5, take 1 tablet on day 6 Patient not taking: Reported on 10/08/2018 12/13/17   Laban Emperor, PA-C    Inpatient Medications: Scheduled Meds:  Continuous Infusions: . heparin Stopped (10/09/18 1300)  . [MAR Hold] nitroGLYCERIN 15 mcg/min (10/09/18 1238)   PRN Meds: [MAR Hold] acetaminophen **OR** [MAR Hold] acetaminophen, [MAR Hold] ALPRAZolam, fentaNYL, midazolam, [MAR Hold]  morphine injection, [MAR Hold] nitroGLYCERIN, [MAR Hold] ondansetron **OR** [MAR Hold] ondansetron (ZOFRAN) IV, [MAR Hold] oxyCODONE  Allergies:   No Known Allergies  Social History:   Social History   Socioeconomic History  . Marital status: Married    Spouse name: Not on file  . Number of children: Not on file  . Years of education: Not on file  .  Highest education level: Not on file  Occupational History  . Not on file  Social Needs  . Financial resource strain: Not on file  . Food insecurity    Worry: Not on file    Inability: Not on file  . Transportation needs    Medical: Not on file    Non-medical: Not on file  Tobacco Use  . Smoking status: Never Smoker  . Smokeless  tobacco: Never Used  Substance and Sexual Activity  . Alcohol use: Yes  . Drug use: Not on file  . Sexual activity: Not on file  Lifestyle  . Physical activity    Days per week: Not on file    Minutes per session: Not on file  . Stress: Not on file  Relationships  . Social Musician on phone: Not on file    Gets together: Not on file    Attends religious service: Not on file    Active member of club or organization: Not on file    Attends meetings of clubs or organizations: Not on file    Relationship status: Not on file  . Intimate partner violence    Fear of current or ex partner: Not on file    Emotionally abused: Not on file    Physically abused: Not on file    Forced sexual activity: Not on file  Other Topics Concern  . Not on file  Social History Narrative  . Not on file    Family History:   History reviewed. No pertinent family history.   ROS:  Please see the history of present illness.  Review of Systems  Constitutional: Negative.   HENT: Negative.   Respiratory: Negative.   Cardiovascular: Positive for chest pain.  Gastrointestinal: Negative.   Musculoskeletal: Negative.   Neurological: Negative.   Psychiatric/Behavioral: Negative.   All other systems reviewed and are negative.   Physical Exam/Data:   Vitals:   10/09/18 0359 10/09/18 0433 10/09/18 0739 10/09/18 1135  BP:  (!) 154/87 120/82 128/88  Pulse: (!) 58 (!) 56 (!) 57 (!) 49  Resp:  18 18   Temp:  (!) 97.5 F (36.4 C)    TempSrc:  Oral    SpO2:  99% 98% 98%  Weight:      Height:        Intake/Output Summary (Last 24 hours) at 10/09/2018 1315 Last data filed at 10/09/2018 1238 Gross per 24 hour  Intake 698.42 ml  Output 100 ml  Net 598.42 ml   Last 3 Weights 10/09/2018 10/08/2018 10/08/2018  Weight (lbs) 227 lb 1.2 oz 229 lb 9.6 oz 230 lb  Weight (kg) 103 kg 104.146 kg 104.327 kg     Body mass index is 32.58 kg/m.  General:  Well nourished, well developed, in no acute distress  HEENT: normal Lymph: no adenopathy Neck: no JVD Endocrine:  No thryomegaly Vascular: No carotid bruits; FA pulses 2+ bilaterally without bruits  Cardiac:  normal S1, S2; RRR; no murmur  Lungs:  clear to auscultation bilaterally, no wheezing, rhonchi or rales  Abd: soft, nontender, no hepatomegaly  Ext: no edema Musculoskeletal:  No deformities, BUE and BLE strength normal and equal Skin: warm and dry  Neuro:  CNs 2-12 intact, no focal abnormalities noted Psych:  Normal affect   EKG:  The EKG was personally reviewed and demonstrates:   Initial EKG 5:15 PM normal sinus rhythm rate 100 bpm no significant ST-T wave changes, possible Q waves 1 and aVL  Repeat EKG 4:37 AM with nonspecific ST abnormality precordial leads, T wave inversions V2 through V4 1 and aVL  Telemetry:  Telemetry was personally reviewed and demonstrates:   Normal sinus rhythm  Relevant CV Studies: Echocardiogram grossly normal LV function  Laboratory Data:  High Sensitivity Troponin:   Recent Labs  Lab 10/08/18 2227 10/08/18 2333 10/09/18 0225 10/09/18 0407 10/09/18 1008  TROPONINIHS 7,178* 8,723* 12,394* 12,962* 11,669*     Chemistry Recent Labs  Lab 10/08/18 1724 10/09/18 0225  NA 139 139  K 3.9 3.5  CL 104 106  CO2 22 24  GLUCOSE 106* 111*  BUN 19 15  CREATININE 0.90 0.75  CALCIUM 9.4 9.0  GFRNONAA >60 >60  GFRAA >60 >60  ANIONGAP 13 9    No results for input(s): PROT, ALBUMIN, AST, ALT, ALKPHOS, BILITOT in the last 168 hours. Hematology Recent Labs  Lab 10/08/18 1724 10/09/18 0225  WBC 16.4* 12.2*  RBC 5.22 4.70  HGB 14.6 13.0  HCT 43.5 39.1  MCV 83.3 83.2  MCH 28.0 27.7  MCHC 33.6 33.2  RDW 13.5 13.6  PLT 368 311   BNPNo results for input(s): BNP, PROBNP in the last 168 hours.  DDimer No results for input(s): DDIMER in the last 168 hours.   Radiology/Studies:  Dg Chest 2 View  Result Date: 10/08/2018 CLINICAL DATA:  Chest pain, epigastric pain, cramping EXAM: CHEST - 2  VIEW COMPARISON:  None FINDINGS: Upper normal heart size. Mediastinal contours and pulmonary vascularity normal. Nodular density LEFT mid lung 18 x 15 x 15 mm. Remaining lungs clear. No pleural effusion or pneumothorax. No acute osseous findings. IMPRESSION: LEFT mid lung nodule 18 x 15 x 15 mm, question in superior segment of the LEFT lower lobe; CT chest recommended to evaluate. Findings called to Dr. Erma HeritageIsaacs on 10/08/2018 at 1803 hours. Electronically Signed   By: Ulyses SouthwardMark  Boles M.D.   On: 10/08/2018 18:05   Ct Angio Chest Pe W And/or Wo Contrast  Result Date: 10/08/2018 CLINICAL DATA:  Chest pain slightly positional EXAM: CT ANGIOGRAPHY CHEST WITH CONTRAST TECHNIQUE: Multidetector CT imaging of the chest was performed using the standard protocol during bolus administration of intravenous contrast. Multiplanar CT image reconstructions and MIPs were obtained to evaluate the vascular anatomy. CONTRAST:  100mL OMNIPAQUE IOHEXOL 350 MG/ML SOLN COMPARISON:  Chest radiograph same day FINDINGS: Cardiovascular: There is a optimal opacification of the pulmonary arteries. There is no central,segmental, or subsegmental filling defects within the pulmonary arteries. The heart is normal in size. No pericardial effusion thickening. No evidence right heart strain. There is normal three-vessel brachiocephalic anatomy without proximal stenosis. The thoracic aorta is normal in appearance. Mediastinum/Nodes: No hilar, mediastinal, or axillary adenopathy. Thyroid gland, trachea, and esophagus demonstrate no significant findings. Lungs/Pleura: There is a solid 1.7 cm pulmonary nodule seen in the posterior left upper lobe adjacent to pulmonary venous vasculature. The remainder of the lungs are clear. No pleural effusion. Upper Abdomen: No acute abnormalities present in the visualized portions of the upper abdomen. Musculoskeletal: No chest wall abnormality. No acute or significant osseous findings. Review of the MIP images confirms the  above findings. IMPRESSION: 1. No acute central, segmental, or subsegmental pulmonary embolism. 2. 1.7 cm solid pulmonary nodule in the posterior left upper lobe. Consider one of the following in 3 months for both low-risk and high-risk individuals: (a) repeat chest CT, (b) follow-up PET-CT, or (c) tissue sampling. This recommendation follows the consensus statement: Guidelines for Management of Incidental Pulmonary Nodules Detected on  CT Images: From the Fleischner Society 2017; Radiology 2017; [id number]. Electronically Signed   By: Jonna ClarkBindu  Avutu M.D.   On: 10/08/2018 20:12    Assessment and Plan:   1. Non-STEMI Long discussion with patient concerning various treatment options, Given continued pain, or abnormal appearing EKG 4:37 AM On heparin and nitro infusion still with discomfort Case discussed with interventional cardiology, we will call in the catheterization team He did have clear diet, catheterization and possible PCI is planned for 12:30 PM I have reviewed the risks, indications, and alternatives to cardiac catheterization, possible angioplasty, and stenting with the patient. Risks include but are not limited to bleeding, infection, vascular injury, stroke, myocardial infection, arrhythmia, kidney injury, radiation-related injury in the case of prolonged fluoroscopy use, emergency cardiac surgery, and death. The patient understands the risks of serious complication is 1-2 in 1000 with diagnostic cardiac cath and 1-2% or less with angioplasty/stenting.  Continue heparin infusion and nitro infusion for now N.p.o., orders placed Beta-blocker as tolerated, aspirin, statin  2.  Hypertriglyceridemia/hyperlipidemia Statin, lifestyle changes  3.  Smoker/marijuana Cessation recommended  4. 1.7 cm solid pulmonary nodule in the posterior left upper lobe.  Seen on CT scan Will need repeat scan, referral to pulmonary   Total encounter time more than 110 minutes  Greater than 50% was spent in  counseling and coordination of care with the patient   For questions or updates, please contact [facility] Please consult [email or web address] for contact info under     Signed, Julien Nordmannimothy Karan Inclan, MD  10/09/2018 1:15 PM

## 2018-10-09 NOTE — Progress Notes (Addendum)
Dr. Vianne Bulls at bedside, verbal orders with read back for clear liquid diet and troponin I (high sensitivity) x1. Dr. Vianne Bulls called Dr. Clayborn Bigness so he is aware. Will continue to monitor patient.   Update 0928: Patient complaining of mild chest pain 3/10, nitro drip and heparin drip still infusing. Also complaining of a headache, PRN med given. Patient tolerated liquid diet fine, no nausea or vomiting. No other complaints at this time. Heart rate 51. Will continue to monitor.

## 2018-10-09 NOTE — Progress Notes (Signed)
Asked patient if he wanted a bath tonight, and patient said, "no". Had a CHG wipe down when he came from the cath lab around 1800. Continue to monitor.

## 2018-10-09 NOTE — Progress Notes (Signed)
ANTICOAGULATION CONSULT NOTE   Pharmacy Consult for Heparin Drip  Indication: chest pain/ACS  No Known Allergies  Patient Measurements: Height: 5\' 10"  (177.8 cm) Weight: 227 lb 1.2 oz (103 kg) IBW/kg (Calculated) : 73 Heparin Dosing Weight: 95.2 kg  Vital Signs: Temp: 97.5 F (36.4 C) (09/05 0433) Temp Source: Oral (09/05 0433) BP: 120/82 (09/05 0739) Pulse Rate: 57 (09/05 0739)  Labs: Recent Labs    10/08/18 1724  10/08/18 1931  10/08/18 2333 10/09/18 0225 10/09/18 0407 10/09/18 1008  HGB 14.6  --   --   --   --  13.0  --   --   HCT 43.5  --   --   --   --  39.1  --   --   PLT 368  --   --   --   --  311  --   --   APTT  --   --  35  --   --   --   --   --   LABPROT  --   --  13.4  --   --   --   --   --   INR  --   --  1.0  --   --   --   --   --   HEPARINUNFRC  --   --   --   --   --  <0.10*  --  0.20*  CREATININE 0.90  --   --   --   --  0.75  --   --   TROPONINIHS 1,552*   < >  --    < > 8,723* 12,394* 10,258*  --    < > = values in this interval not displayed.    Estimated Creatinine Clearance: 160.9 mL/min (by C-G formula based on SCr of 0.75 mg/dL).   Medical History: Past Medical History:  Diagnosis Date  . Tobacco use     Assessment: Patient is a 33yo male admitted with chest pain and elevated troponin. Pharmacy consulted for Heparin dosing.  9/5 @ 0225 HL = < 0.10 increased rate to 1550 and bolus with 2800 units x 1.  9/5 @1008  HL 0.20   Goal of Therapy:  Heparin level 0.3-0.7 units/ml Monitor platelets by anticoagulation protocol: Yes   Plan:  Rebolus w/ Heparin 1400 units x 1  Increase heparin rate to 1700 units/hr Recheck heparin level 6 hours after rate increase  Eleonore Chiquito, PharmD, BCPS 10/09/2018 10:50 AM

## 2018-10-09 NOTE — Progress Notes (Signed)
[facility] [place] at [facility]   PATIENT NAME: Jim NevinsCorey Medina    MR#:  [id number]  DATE OF BIRTH:  [birth year]  SUBJECTIVE: Patient is seen at bedside, admitted for chest pain, non-ST elevation MI.  Patient developed chest pressure yesterday morning associated with sweating, diaphoresis, and the symptoms persisted so he came to hospital.  Patient initial EKG unremarkable but the EKG from this morning showed T wave inversions in anterior leads, T wave flattening in lateral leads.  CHIEF COMPLAINT:   Chief Complaint  Patient presents with  . Abdominal Pain  . Chest Pain  On nitro drip, heparin drip, patient still has dull chest pain but headache secondary to nitro.  Urine toxicology positive for opiates, cannabis.  He told me that he took the drugs a week ago but he does not regularly use recreational drugs, he does not smoke or drink.  Family history of premature heart disease REVIEW OF SYSTEMS:   ROS CONSTITUTIONAL: No fever, fatigue or weakness.  EYES: No blurred or double vision.  EARS, NOSE, AND THROAT: No tinnitus or ear pain.  RESPIRATORY: No cough, shortness of breath, wheezing or hemoptysis.  CARDIOVASCULAR: -dull midsternal chest pain, headache gASTROINTESTINAL: No nausea, vomiting, diarrhea or abdominal pain.  GENITOURINARY: No dysuria, hematuria.  ENDOCRINE: No polyuria, nocturia,  HEMATOLOGY: No anemia, easy bruising or bleeding SKIN: No rash or lesion. MUSCULOSKELETAL: No joint pain or arthritis.   NEUROLOGIC: No tingling, numbness, weakness.  PSYCHIATRY: No anxiety or depression.   DRUG ALLERGIES:  No Known Allergies  VITALS:  Blood pressure 120/82, pulse (!) 57, temperature (!) 97.5 F (36.4 C), temperature source Oral, resp. rate 18, height 5\' 10"  (1.778 m), weight 103 kg, SpO2 98 %.  PHYSICAL EXAMINATION:  GENERAL:  33 y.o.-year-old patient lying in the bed with no acute distress.  EYES: Pupils equal, round, reactive to light and  accommodation. No scleral icterus. Extraocular muscles intact.  HEENT: Head atraumatic, normocephalic. Oropharynx and nasopharynx clear.  NECK:  Supple, no jugular venous distention. No thyroid enlargement, no tenderness.  LUNGS: Normal breath sounds bilaterally, no wheezing, rales,rhonchi or crepitation. No use of accessory muscles of respiration.  CARDIOVASCULAR: S1, S2 normal. No murmurs, rubs, or gallops.  ABDOMEN: Soft, nontender, nondistended. Bowel sounds present. No organomegaly or mass.  EXTREMITIES: No pedal edema, cyanosis, or clubbing.  NEUROLOGIC: Cranial nerves II through XII are intact. Muscle strength 5/5 in all extremities. Sensation intact. Gait not checked.  PSYCHIATRIC: The patient is alert and oriented x 3.  SKIN: No obvious rash, lesion, or ulcer.    LABORATORY PANEL:   CBC Recent Labs  Lab 10/09/18 0225  WBC 12.2*  HGB 13.0  HCT 39.1  PLT 311   ------------------------------------------------------------------------------------------------------------------  Chemistries  Recent Labs  Lab 10/09/18 0225  NA 139  K 3.5  CL 106  CO2 24  GLUCOSE 111*  BUN 15  CREATININE 0.75  CALCIUM 9.0   ------------------------------------------------------------------------------------------------------------------  Cardiac Enzymes No results for input(s): TROPONINI in the last 168 hours. ------------------------------------------------------------------------------------------------------------------  RADIOLOGY:  Dg Chest 2 View  Result Date: 10/08/2018 CLINICAL DATA:  Chest pain, epigastric pain, cramping EXAM: CHEST - 2 VIEW COMPARISON:  None FINDINGS: Upper normal heart size. Mediastinal contours and pulmonary vascularity normal. Nodular density LEFT mid lung 18 x 15 x 15 mm. Remaining lungs clear. No pleural effusion or pneumothorax. No acute osseous findings. IMPRESSION: LEFT mid lung nodule 18 x 15 x 15 mm, question in superior segment of the LEFT lower lobe;  CT chest recommended to evaluate. Findings called to Dr. Ellender Hose on 10/08/2018 at 1803 hours. Electronically Signed   By: Lavonia Dana M.D.   On: 10/08/2018 18:05   Ct Angio Chest Pe W And/or Wo Contrast  Result Date: 10/08/2018 CLINICAL DATA:  Chest pain slightly positional EXAM: CT ANGIOGRAPHY CHEST WITH CONTRAST TECHNIQUE: Multidetector CT imaging of the chest was performed using the standard protocol during bolus administration of intravenous contrast. Multiplanar CT image reconstructions and MIPs were obtained to evaluate the vascular anatomy. CONTRAST:  127mL OMNIPAQUE IOHEXOL 350 MG/ML SOLN COMPARISON:  Chest radiograph same day FINDINGS: Cardiovascular: There is a optimal opacification of the pulmonary arteries. There is no central,segmental, or subsegmental filling defects within the pulmonary arteries. The heart is normal in size. No pericardial effusion thickening. No evidence right heart strain. There is normal three-vessel brachiocephalic anatomy without proximal stenosis. The thoracic aorta is normal in appearance. Mediastinum/Nodes: No hilar, mediastinal, or axillary adenopathy. Thyroid gland, trachea, and esophagus demonstrate no significant findings. Lungs/Pleura: There is a solid 1.7 cm pulmonary nodule seen in the posterior left upper lobe adjacent to pulmonary venous vasculature. The remainder of the lungs are clear. No pleural effusion. Upper Abdomen: No acute abnormalities present in the visualized portions of the upper abdomen. Musculoskeletal: No chest wall abnormality. No acute or significant osseous findings. Review of the MIP images confirms the above findings. IMPRESSION: 1. No acute central, segmental, or subsegmental pulmonary embolism. 2. 1.7 cm solid pulmonary nodule in the posterior left upper lobe. Consider one of the following in 3 months for both low-risk and high-risk individuals: (a) repeat chest CT, (b) follow-up PET-CT, or (c) tissue sampling. This recommendation follows the  consensus statement: Guidelines for Management of Incidental Pulmonary Nodules Detected on CT Images: From the Fleischner Society 2017; Radiology 2017; 284:228-243. Electronically Signed   By: Prudencio Pair M.D.   On: 10/08/2018 20:12    EKG:   Orders placed or performed during the hospital encounter of 10/08/18  . ED EKG  . ED EKG  . EKG 12-Lead  . EKG 12-Lead  . EKG 12-Lead  . EKG 12-Lead  . EKG 12-Lead  . EKG 12-Lead    ASSESSMENT AND PLAN:   #1. non-ST elevation MI ; high-sensitivity troponin up to 12,962.  EKG changes are concerning, patient he is admitted to telemetry, continue nitro drip, heparin drip, I spoke with Dr. Clayborn Bigness as he was on call yesterday, recommended 2 rechecks troponins again, echocardiogram, start clears, possible cardiac cath later today depending upon cardiac cath schedule as per him.  Discussed same with patient. #2 polysubstance abuse, urine toxicology positive for opiates, asked about this with patient, he said he used them a week ago, he does not regularly use illicit substances, denies any smoking or alcohol history.  All the records are reviewed and case discussed with Care Management/Social Workerr. Management plans discussed with the patient, family and they are in agreement.  CODE STATUS: Full code  TOTAL TIME TAKING CARE OF THIS PATIENT: 38 minutes.   P likely discharge in next 2 to 3 days depending upon cardiology evaluation, possible cardiac cath.  Will check fasting lipids.   Epifanio Lesches M.D on 10/09/2018 at 10:27 AM  Between 7am to 6pm - Pager - [phone]  After 6pm go to [email or web address] - password [email or web address] [facility]  Office  [phone]  CC: Primary care physician; Patient, No Pcp Per   Note: This dictation was prepared with Dragon dictation along with smaller phrase  technology. Any transcriptional errors that result from this process are unintentional.

## 2018-10-10 ENCOUNTER — Inpatient Hospital Stay (HOSPITAL_COMMUNITY)
Admit: 2018-10-10 | Discharge: 2018-10-10 | Disposition: A | Discharge status: Home / Self Care | Payer: 59 | Attending: Internal Medicine | Admitting: Internal Medicine

## 2018-10-10 DIAGNOSIS — I517 Cardiomegaly: Secondary | ICD-10-CM

## 2018-10-10 DIAGNOSIS — E785 Hyperlipidemia, unspecified: Secondary | ICD-10-CM

## 2018-10-10 DIAGNOSIS — R079 Chest pain, unspecified: Secondary | ICD-10-CM

## 2018-10-10 LAB — HEMOGLOBIN A1C
Hgb A1c MFr Bld: 5.5 % (ref 4.8–5.6)
Mean Plasma Glucose: 111.15 mg/dL

## 2018-10-10 LAB — ECHOCARDIOGRAM COMPLETE
Height: 70 in
Weight: 3633.18 oz

## 2018-10-10 MED ORDER — TICAGRELOR 90 MG PO TABS
90.0000 mg | ORAL_TABLET | Freq: Two times a day (BID) | ORAL | Status: DC
  Administered 2018-10-10: 90 mg via ORAL
  Filled 2018-10-10: qty 1

## 2018-10-10 MED ORDER — METOPROLOL TARTRATE 25 MG PO TABS: 12.5000 mg | ORAL_TABLET | Freq: Every day | ORAL | 0 refills | Status: DC

## 2018-10-10 MED ORDER — LOSARTAN POTASSIUM 25 MG PO TABS
25.0000 mg | ORAL_TABLET | Freq: Every day | ORAL | Status: DC
  Administered 2018-10-10: 12:00:00 25 mg via ORAL
  Filled 2018-10-10: qty 1

## 2018-10-10 MED ORDER — LOSARTAN POTASSIUM 25 MG PO TABS: 25.0000 mg | ORAL_TABLET | Freq: Every day | ORAL | 1 refills | Status: DC

## 2018-10-10 MED ORDER — ASPIRIN EC 81 MG PO TBEC
81.0000 mg | DELAYED_RELEASE_TABLET | Freq: Every day | ORAL | Status: DC
  Administered 2018-10-10: 81 mg via ORAL
  Filled 2018-10-10: qty 1

## 2018-10-10 MED ORDER — POTASSIUM CHLORIDE CRYS ER 20 MEQ PO TBCR
40.0000 meq | EXTENDED_RELEASE_TABLET | Freq: Once | ORAL | Status: AC
  Administered 2018-10-10: 40 meq via ORAL
  Filled 2018-10-10: qty 2

## 2018-10-10 MED ORDER — TICAGRELOR 90 MG PO TABS: 90.0000 mg | ORAL_TABLET | Freq: Two times a day (BID) | ORAL | 1 refills | Status: DC

## 2018-10-10 MED ORDER — ROSUVASTATIN CALCIUM 20 MG PO TABS: 40.0000 mg | ORAL_TABLET | Freq: Every day | ORAL | 3 refills | Status: DC

## 2018-10-10 MED ORDER — ASPIRIN 81 MG PO TBEC: 81.0000 mg | DELAYED_RELEASE_TABLET | Freq: Every day | ORAL | 0 refills | Status: AC

## 2018-10-10 MED ORDER — ATORVASTATIN CALCIUM 40 MG PO TABS: 40.0000 mg | ORAL_TABLET | Freq: Every day | ORAL | 2 refills | Status: AC

## 2018-10-10 MED ORDER — ATORVASTATIN CALCIUM 20 MG PO TABS: 40.0000 mg | ORAL_TABLET | Freq: Every day | ORAL | Status: DC

## 2018-10-10 MED ORDER — METOPROLOL SUCCINATE ER 25 MG PO TB24
12.5000 mg | ORAL_TABLET | Freq: Every day | ORAL | Status: DC
  Administered 2018-10-10: 12:00:00 12.5 mg via ORAL
  Filled 2018-10-10: qty 1

## 2018-10-10 NOTE — Progress Notes (Signed)
Patient denies pain and reports feeling much better.  His color and affect are improved as well.  Patient's vital signs are within normal limits with some HR going brady at times in the low to mid 40s, but usually comes right back up.  Oriented x 4 and WDL for lung sounds, GU, GI, and skin.  Cardiac: sometimes brady.  Phillis Knack, RN

## 2018-10-10 NOTE — Discharge Summary (Signed)
Jim NevinsCorey Medina, is a 33 y.o. male  DOB 12-Jul-1985  MRN [id number].  Admission date:  10/08/2018  Admitting Physician  Oralia Manisavid Willis, MD  Discharge Date:  10/10/2018   Primary MD  Patient, No Pcp Per  Recommendations for primary care physician for things to follow:   Follow-up with Dr. Mariah MillingGollan in 1 to 2 weeks   Admission Diagnosis  Chest   Discharge Diagnosis  Chest   Principal Problem:   NSTEMI (non-ST elevated myocardial infarction) [facility](HCC) Active Problems:   Hyperlipidemia      Past Medical History:  Diagnosis Date  . Tobacco use     Past Surgical History:  Procedure Laterality Date  . NO PAST SURGERIES         History of present illness and  Hospital Course:     Kindly see H&P for history of present illness and admission details, please review complete Labs, Consult reports and Test reports for all details in brief  HPI  from the history and physical done on the day of admission 33 year old male patient admitted for chest pain, found to have elevated liver troponins.   Hospital Course  #1 acute left-sided chest pain secondary to non-ST elevation MI, patient troponin peaked up to 13,052.  Seen by cardiology, patient received aspirin, heparin drip, nitro drip for chest pain, beta-blockers not given due to bradycardia, patient is taken to cardiac cath, cath revealed 100% occlusion of second diagonal status post drug-eluting stent placed, after that patient chest pain resolved, monitored in ICU overnight for close monitoring of any arrhythmias.  Patient is to be on dual antiplatelet therapy with aspirin 81 mg daily, Brilinta 90 mg p.o. twice daily for minimum of 12 months, discussed about  t this with patient, he understands.  Patient also referred to cardiac rehab.  Needs to be on aspirin, Brilinta, Lipitor 40 mg  daily, low-dose beta-blockers  at metoprolol 12.5 mg p.o. daily, losartan also is ordered at lower dose.  #2/ severe hyperlipidemia, patient lipid panel shows LDL unable to calculate, triglyceride 423.  Started on high intensity statins, advised the patient to lose weight and also diet modifications required, patient is very young and has severe hyperlipidemia, and CAD with stent placement now. Patient offered to stay today but he wants to go home, will be late discharge today.   Discharge Condition:    Follow UP  Follow-up Information    [facility] Follow up.   Specialty: Cardiac Rehabilitation Why: Your Cardiologist has referred you to outpatient Cardiac Rehab at [facility].  The Cardiac Rehab dept will contact you within one to two weeks of discharge to schedule your first appointment.   Contact information: [address] [id number] [place] [id number] [phone]       Antonieta IbaGollan, Timothy J, MD. Schedule an appointment as soon as possible for a visit in 5 day(s).   Specialty: Cardiology Contact information: [address] [place] [address] [phone]             Discharge Instructions  and  Discharge Medications     Discharge Instructions    AMB Referral to Cardiac Rehabilitation - Phase II   Complete by: As directed    Diagnosis: NSTEMI   After initial evaluation and assessments completed: Virtual Based Care may be provided alone or in conjunction with Phase 2 Cardiac Rehab based on patient barriers.: Yes   Amb Referral to Cardiac Rehabilitation   Complete by: As directed  Diagnosis:  Coronary Stents NSTEMI     After initial evaluation and assessments completed: Virtual Based Care may be provided alone or in conjunction with Phase 2 Cardiac Rehab based on patient barriers.: Yes     Allergies as of 10/10/2018   No Known Allergies     Medication List    STOP taking these medications    cyclobenzaprine 5 MG tablet Commonly known as: FLEXERIL   ketorolac 10 MG tablet Commonly known as: TORADOL   lidocaine 5 % Commonly known as: Lidoderm   oxyCODONE-acetaminophen 5-325 MG tablet Commonly known as: Percocet   predniSONE 10 MG tablet Commonly known as: DELTASONE     TAKE these medications   aspirin 81 MG EC tablet Take 1 tablet (81 mg total) by mouth daily. Start taking on: October 11, 2018   atorvastatin 40 MG tablet Commonly known as: LIPITOR Take 1 tablet (40 mg total) by mouth daily at 6 PM.   losartan 25 MG tablet Commonly known as: COZAAR Take 1 tablet (25 mg total) by mouth daily.   metoprolol tartrate 25 MG tablet Commonly known as: LOPRESSOR Take 0.5 tablets (12.5 mg total) by mouth daily before breakfast.   ticagrelor 90 MG Tabs tablet Commonly known as: BRILINTA Take 1 tablet (90 mg total) by mouth 2 (two) times daily.         Diet and Activity recommendation: See Discharge Instructions above   Consults obtained - stable   Major procedures and Radiology Reports - PLEASE review detailed and final reports for all details, in brief -   \   Dg Chest 2 View  Result Date: 10/08/2018 CLINICAL DATA:  Chest pain, epigastric pain, cramping EXAM: CHEST - 2 VIEW COMPARISON:  None FINDINGS: Upper normal heart size. Mediastinal contours and pulmonary vascularity normal. Nodular density LEFT mid lung 18 x 15 x 15 mm. Remaining lungs clear. No pleural effusion or pneumothorax. No acute osseous findings. IMPRESSION: LEFT mid lung nodule 18 x 15 x 15 mm, question in superior segment of the LEFT lower lobe; CT chest recommended to evaluate. Findings called to Dr. Ellender Hose on 10/08/2018 at 1803 hours. Electronically Signed   By: Lavonia Dana M.D.   On: 10/08/2018 18:05   Ct Angio Chest Pe W And/or Wo Contrast  Result Date: 10/08/2018 CLINICAL DATA:  Chest pain slightly positional EXAM: CT ANGIOGRAPHY CHEST WITH CONTRAST TECHNIQUE: Multidetector CT imaging  of the chest was performed using the standard protocol during bolus administration of intravenous contrast. Multiplanar CT image reconstructions and MIPs were obtained to evaluate the vascular anatomy. CONTRAST:  121mL OMNIPAQUE IOHEXOL 350 MG/ML SOLN COMPARISON:  Chest radiograph same day FINDINGS: Cardiovascular: There is a optimal opacification of the pulmonary arteries. There is no central,segmental, or subsegmental filling defects within the pulmonary arteries. The heart is normal in size. No pericardial effusion thickening. No evidence right heart strain. There is normal three-vessel brachiocephalic anatomy without proximal stenosis. The thoracic aorta is normal in appearance. Mediastinum/Nodes: No hilar, mediastinal, or axillary adenopathy. Thyroid gland, trachea, and esophagus demonstrate no significant findings. Lungs/Pleura: There is a solid 1.7 cm pulmonary nodule seen in the posterior left upper lobe adjacent to pulmonary venous vasculature. The remainder of the lungs are clear. No pleural effusion. Upper Abdomen: No acute abnormalities present in the visualized portions of the upper abdomen. Musculoskeletal: No chest wall abnormality. No acute or significant osseous findings. Review of the MIP images confirms the above findings. IMPRESSION: 1. No acute central, segmental, or subsegmental pulmonary embolism. 2.  1.7 cm solid pulmonary nodule in the posterior left upper lobe. Consider one of the following in 3 months for both low-risk and high-risk individuals: (a) repeat chest CT, (b) follow-up PET-CT, or (c) tissue sampling. This recommendation follows the consensus statement: Guidelines for Management of Incidental Pulmonary Nodules Detected on CT Images: From the Fleischner Society 2017; Radiology 2017; 284:228-243. Electronically Signed   By: Jonna Clark M.D.   On: 10/08/2018 20:12    Micro Results     Recent Results (from the past 240 hour(s))  SARS Coronavirus 2 [facility] order, Performed  in [facility] hospital lab) Nasopharyngeal Nasopharyngeal Swab     Status: None   Collection Time: 10/08/18  7:31 PM   Specimen: Nasopharyngeal Swab  Result Value Ref Range Status   SARS Coronavirus 2 NEGATIVE NEGATIVE Final    Comment: (NOTE) If result is NEGATIVE SARS-CoV-2 target nucleic acids are NOT DETECTED. The SARS-CoV-2 RNA is generally detectable in upper and lower  respiratory specimens during the acute phase of infection. The lowest  concentration of SARS-CoV-2 viral copies this assay can detect is 250  copies / mL. A negative result does not preclude SARS-CoV-2 infection  and should not be used as the sole basis for treatment or other  patient management decisions.  A negative result may occur with  improper specimen collection / handling, submission of specimen other  than nasopharyngeal swab, presence of viral mutation(s) within the  areas targeted by this assay, and inadequate number of viral copies  (<250 copies / mL). A negative result must be combined with clinical  observations, patient history, and epidemiological information. If result is POSITIVE SARS-CoV-2 target nucleic acids are DETECTED. The SARS-CoV-2 RNA is generally detectable in upper and lower  respiratory specimens dur ing the acute phase of infection.  Positive  results are indicative of active infection with SARS-CoV-2.  Clinical  correlation with patient history and other diagnostic information is  necessary to determine patient infection status.  Positive results do  not rule out bacterial infection or co-infection with other viruses. If result is PRESUMPTIVE POSTIVE SARS-CoV-2 nucleic acids MAY BE PRESENT.   A presumptive positive result was obtained on the submitted specimen  and confirmed on repeat testing.  While 2019 novel coronavirus  (SARS-CoV-2) nucleic acids may be present in the submitted sample  additional confirmatory testing may be necessary for epidemiological  and / or clinical  management purposes  to differentiate between  SARS-CoV-2 and other Sarbecovirus currently known to infect humans.  If clinically indicated additional testing with an alternate test  methodology [phone]) is advised. The SARS-CoV-2 RNA is generally  detectable in upper and lower respiratory sp ecimens during the acute  phase of infection. The expected result is Negative. Fact Sheet for Patients:  BoilerBrush.com.cy Fact Sheet for Healthcare Providers: https://pope.com/ This test is not yet approved or cleared by the [place] FDA and has been authorized for detection and/or diagnosis of SARS-CoV-2 by FDA under an Emergency Use Authorization (EUA).  This EUA will remain in effect (meaning this test can be used) for the duration of the COVID-19 declaration under Section 564(b)(1) of the Act, 21 U.S.C. section 360bbb-3(b)(1), unless the authorization is terminated or revoked sooner. Performed at [facility], [address] [place], [place] [address]   MRSA PCR Screening     Status: None   Collection Time: 10/09/18  2:54 PM   Specimen: Nasopharyngeal  Result Value Ref Range Status   MRSA by PCR NEGATIVE NEGATIVE Final  Comment:        The GeneXpert MRSA Assay (FDA approved for NASAL specimens only), is one component of a comprehensive MRSA colonization surveillance program. It is not intended to diagnose MRSA infection nor to guide or monitor treatment for MRSA infections. Performed at [facility], [address] [place], [place] [address]        Today   Subjective:   Jim NevinsCorey Medina today has no headache,no chest abdominal pain,no new weakness tingling or numbness, feels much better wants to go home today.   Objective:   Blood pressure (!) 147/91, pulse 68, temperature 97.8 F (36.6 C), temperature source Oral, resp. rate 18, height 5\' 10"  (1.778 m), weight 102.6 kg, SpO2 98 %.   Intake/Output  Summary (Last 24 hours) at 10/10/2018 1240 Last data filed at 10/10/2018 1056 Gross per 24 hour  Intake 120 ml  Output 975 ml  Net -855 ml    Exam Awake Alert, Oriented x 3, No new F.N deficits, Normal affect [place].AT,PERRAL Supple Neck,No JVD, No cervical lymphadenopathy appriciated.  Symmetrical Chest wall movement, Good air movement bilaterally, CTAB RRR,No Gallops,Rubs or new Murmurs, No Parasternal Heave +ve B.Sounds, Abd Soft, Non tender, No organomegaly appriciated, No rebound -guarding or rigidity. No Cyanosis, Clubbing or edema, No new Rash or bruise  Data Review   CBC w Diff:  Lab Results  Component Value Date   WBC 12.2 (H) 10/09/2018   HGB 13.0 10/09/2018   HCT 39.1 10/09/2018   PLT 311 10/09/2018    CMP:  Lab Results  Component Value Date   NA 138 10/09/2018   K 3.5 10/09/2018   CL 100 10/09/2018   CO2 26 10/09/2018   BUN 11 10/09/2018   CREATININE 0.83 10/09/2018   PROT 7.1 10/09/2018   ALBUMIN 4.3 10/09/2018   BILITOT 1.4 (H) 10/09/2018   ALKPHOS 50 10/09/2018   AST 82 (H) 10/09/2018   ALT 47 (H) 10/09/2018  .   Total Time in preparing paper work, data evaluation and todays exam - 35 minutes  Katha HammingSnehalatha Jackqulyn Mendel M.D on 10/10/2018 at 12:40 PM    Note: This dictation was prepared with Dragon dictation along with smaller phrase technology. Any transcriptional errors that result from this process are unintentional.

## 2018-10-10 NOTE — Plan of Care (Signed)

## 2018-10-10 NOTE — Progress Notes (Signed)
Pt transported to DC via [place] accompanied by Nurse Tech. Pt verbalized understanding of all information given and in NAD upon DC.

## 2018-10-10 NOTE — Progress Notes (Signed)
     Wrangler Penning was admitted to the Hospital on 10/08/2018 and Discharged  10/10/2018 and should be excused from work/school   for  20 days starting 10/08/2018 , patient is cardiology follow-up regarding further restrictions.  Needs to follow-up with cardiology before going back to work.   Epifanio Lesches M.D on 10/10/2018,at 12:05 PM

## 2018-10-10 NOTE — Progress Notes (Signed)
Progress Note  Patient Name: Jim Medina Date of Encounter: 10/10/2018  Primary Cardiologist: [facility]  Subjective   No complaints overnight, fullness in the chest has resolved Would like to go home today Reports wife trying to manage with 85-month-old child, he needs to be home to help Work is very active, works as a Games developer, does framing, demolition, in crawl spaces Reports he has been walking with no symptoms of shortness of breath or chest discomfort  Inpatient Medications    Scheduled Meds: . aspirin EC  81 mg Oral Daily  . atorvastatin  40 mg Oral q1800  . Chlorhexidine Gluconate Cloth  6 each Topical Daily  . losartan  25 mg Oral Daily  . metoprolol succinate  12.5 mg Oral Daily  . ticagrelor  90 mg Oral BID   Continuous Infusions:  PRN Meds: acetaminophen **OR** acetaminophen, ALPRAZolam, morphine injection, ondansetron **OR** ondansetron (ZOFRAN) IV, oxyCODONE, zolpidem   Vital Signs    Vitals:   10/10/18 0700 10/10/18 0800 10/10/18 0919 10/10/18 1211  BP: 130/88 130/88 (!) 156/88 (!) 147/91  Pulse: (!) 52 89 84 68  Resp: 18 19 18 18   Temp:  98.4 F (36.9 C) 97.8 F (36.6 C)   TempSrc:  Oral Oral   SpO2: 98% 99% 99% 98%  Weight:   102.6 kg   Height:   5\' 10"  (1.778 m)     Intake/Output Summary (Last 24 hours) at 10/10/2018 1521 Last data filed at 10/10/2018 1056 Gross per 24 hour  Intake 120 ml  Output 625 ml  Net -505 ml   Last 3 Weights 10/10/2018 10/09/2018 10/08/2018  Weight (lbs) 226 lb 3.2 oz 227 lb 1.2 oz 229 lb 9.6 oz  Weight (kg) 102.604 kg 103 kg 104.146 kg      Telemetry    Normal sinus rhythm- Personally Reviewed  ECG    - Personally Reviewed  Physical Exam   GEN: No acute distress.   Neck: No JVD Cardiac: RRR, no murmurs, rubs, or gallops.  Respiratory: Clear to auscultation bilaterally. GI: Soft, nontender, non-distended  MS: No edema; No deformity. Neuro:  Nonfocal  Psych: Normal affect   Labs    High Sensitivity  Troponin:   Recent Labs  Lab 10/09/18 0225 10/09/18 0407 10/09/18 1008 10/09/18 1958 10/09/18 2132  TROPONINIHS 12,394* 32,202* 11,669* 12,457* 13,052*      Chemistry Recent Labs  Lab 10/08/18 1724 10/09/18 0225 10/09/18 1958  NA 139 139 138  K 3.9 3.5 3.5  CL 104 106 100  CO2 22 24 26   GLUCOSE 106* 111* 97  BUN 19 15 11   CREATININE 0.90 0.75 0.83  CALCIUM 9.4 9.0 9.3  PROT  --   --  7.1  ALBUMIN  --   --  4.3  AST  --   --  82*  ALT  --   --  47*  ALKPHOS  --   --  50  BILITOT  --   --  1.4*  GFRNONAA >60 >60 >60  GFRAA >60 >60 >60  ANIONGAP 13 9 12      Hematology Recent Labs  Lab 10/08/18 1724 10/09/18 0225  WBC 16.4* 12.2*  RBC 5.22 4.70  HGB 14.6 13.0  HCT 43.5 39.1  MCV 83.3 83.2  MCH 28.0 27.7  MCHC 33.6 33.2  RDW 13.5 13.6  PLT 368 311    BNPNo results for input(s): BNP, PROBNP in the last 168 hours.   DDimer No results for input(s): DDIMER in the last  168 hours.   Radiology    Dg Chest 2 View  Result Date: 10/08/2018 CLINICAL DATA:  Chest pain, epigastric pain, cramping EXAM: CHEST - 2 VIEW COMPARISON:  None FINDINGS: Upper normal heart size. Mediastinal contours and pulmonary vascularity normal. Nodular density LEFT mid lung 18 x 15 x 15 mm. Remaining lungs clear. No pleural effusion or pneumothorax. No acute osseous findings. IMPRESSION: LEFT mid lung nodule 18 x 15 x 15 mm, question in superior segment of the LEFT lower lobe; CT chest recommended to evaluate. Findings called to Dr. Erma Heritage on 10/08/2018 at 1803 hours. Electronically Signed   By: Ulyses Southward M.D.   On: 10/08/2018 18:05   Ct Angio Chest Pe W And/or Wo Contrast  Result Date: 10/08/2018 CLINICAL DATA:  Chest pain slightly positional EXAM: CT ANGIOGRAPHY CHEST WITH CONTRAST TECHNIQUE: Multidetector CT imaging of the chest was performed using the standard protocol during bolus administration of intravenous contrast. Multiplanar CT image reconstructions and MIPs were obtained to  evaluate the vascular anatomy. CONTRAST:  OMNIPAQUE IOHEXOL 350 MG/ML SOLN COMPARISON:  Chest radiograph same day FINDINGS: Cardiovascular: There is a optimal opacification of the pulmonary arteries. There is no central,segmental, or subsegmental filling defects within the pulmonary arteries. The heart is normal in size. No pericardial effusion thickening. No evidence right heart strain. There is normal three-vessel brachiocephalic anatomy without proximal stenosis. The thoracic aorta is normal in appearance. Mediastinum/Nodes: No hilar, mediastinal, or axillary adenopathy. Thyroid gland, trachea, and esophagus demonstrate no significant findings. Lungs/Pleura: There is a solid 1.7 cm pulmonary nodule seen in the posterior left upper lobe adjacent to pulmonary venous vasculature. The remainder of the lungs are clear. No pleural effusion. Upper Abdomen: No acute abnormalities present in the visualized portions of the upper abdomen. Musculoskeletal: No chest wall abnormality. No acute or significant osseous findings. Review of the MIP images confirms the above findings. IMPRESSION: 1. No acute central, segmental, or subsegmental pulmonary embolism. 2. 1.7 cm solid pulmonary nodule in the posterior left upper lobe. Consider one of the following in 3 months for both low-risk and high-risk individuals: (a) repeat chest CT, (b) follow-up PET-CT, or (c) tissue sampling. This recommendation follows the consensus statement: Guidelines for Management of Incidental Pulmonary Nodules Detected on CT Images: From the Fleischner Society 2017; Radiology 2017; 284:228-243. Electronically Signed   By: Jonna Clark M.D.   On: 10/08/2018 20:12    Cardiac Studies   Echocardiogram Discussed with him, personally reviewed by myself Ejection fraction 50%, Anteroseptal and apical wall hypokinesis Moderate LVH noted, mildly dilated left atrium  Patient Profile      Jim Medina is a 33 y.o. male with a hx of smoking with  marijuana, hypertriglyceridemia, presenting with chest pain/unstable angina, non-STEMI  Assessment & Plan    1. Non-STEMI Peak troponin 13,000 Cardiac catheterization yesterday Occluded proximal diagonal #2, moderate to large size vessel PCI, tolerated well.  Results discussed with him in detail Beta-blocker  aspirin,  Brilinta , statin, low-dose losartan initiated -He may benefit from cardiac rehab  2.  Hypertriglyceridemia/hyperlipidemia Statin, lifestyle changes Lipitor 40 daily  3.  Smoker/marijuana Cessation recommended  4. 1.7 cm solid pulmonary nodule in the posterior left upper lobe.  Seen on CT scan Will need repeat scan, referral to pulmonary Etiology unclear  5.  Left ventricular hypertrophy Moderate, results discussed with him Also with mildly dilated left atrium He denies sleep apnea symptoms though does snore sometimes -We will discuss with him again as outpatient benefit of sleep  study  Long discussion concerning recent catheterization results, lifestyle modifications needed, discussion of sleep apnea work-up, echocardiogram findings, discharge instructions, long discussion about work restrictions, time out of work  Total encounter time more than 35 minutes  Greater than 50% was spent in counseling and coordination of care with the patient   For questions or updates, please contact [facility] Please consult [email or web address] for contact info under        Signed, Julien Nordmannimothy Hasset Chaviano, MD  10/10/2018, 3:21 PM

## 2018-10-10 NOTE — Consult Note (Signed)
PULMONARY / CRITICAL CARE MEDICINE  Name: Jim NevinsCorey Agro MRN: [id number] DOB: 03-18-1985    LOS: 2  Referring Provider: Dr. Luberta MutterKonidena Reason for Referral: Acute coronary syndrome  HPI: 33 year old male with no significant medical history except obesity and tobacco use who presented with epigastric and chest pain that was associated with shortness of breath, and radiates to his back and neck.  He was found to have elevated troponins.  He was monitored overnight and taken to the Cath Lab today.  He was found to have a complete occlusion of the diagonal vessel.  He is now status post stent placement with complete resolution of lesion.  He reports complete resolution of chest pain.  He is being admitted to the ICU for overnight monitoring.  Past Medical History:  Diagnosis Date  . Tobacco use    Past Surgical History:  Procedure Laterality Date  . NO PAST SURGERIES     No current facility-administered medications on file prior to encounter.    Current Outpatient Medications on File Prior to Encounter  Medication Sig  . cyclobenzaprine (FLEXERIL) 5 MG tablet Take 1-2 tablets 3 times daily as needed (Patient not taking: Reported on 10/08/2018)  . ketorolac (TORADOL) 10 MG tablet Take 1 tablet (10 mg total) by mouth every 6 (six) hours as needed. (Patient not taking: Reported on 10/08/2018)  . lidocaine (LIDODERM) 5 % Place 1 patch onto the skin daily. Remove & Discard patch within 12 hours or as directed by MD (Patient not taking: Reported on 10/08/2018)  . oxyCODONE-acetaminophen (PERCOCET) 5-325 MG tablet Take 1 tablet by mouth every 6 (six) hours as needed for severe pain. (Patient not taking: Reported on 10/08/2018)  . predniSONE (DELTASONE) 10 MG tablet Take 6 tablets on day 1, take 5 tablets on day 2, take 4 tablets on day 3, take 3 tablets on day 4, take 2 tablets on day 5, take 1 tablet on day 6 (Patient not taking: Reported on 10/08/2018)    Allergies No Known Allergies  Family  History History reviewed. No pertinent family history. Social History  reports that he has never smoked. He has never used smokeless tobacco. He reports current alcohol use. No history on file for drug.  Review Of Systems:   Constitutional: Negative for fever and chills.  HENT: Negative for congestion and rhinorrhea.  Eyes: Negative for redness and visual disturbance.  Respiratory: Negative for shortness of breath and wheezing.  Cardiovascular: Positive for chest pain that has now resolved post intervention Gastrointestinal: Negative  for nausea , vomiting and abdominal pain and  Loose stools Genitourinary: Negative for dysuria and urgency.  Endocrine: Denies polyuria, polyphagia and heat intolerance Musculoskeletal: Negative for myalgias and arthralgias.  Skin: Negative for pallor and wound.  Neurological: Negative for dizziness and headaches   VITAL SIGNS: BP 123/69   Pulse (!) 59   Temp 98.5 F (36.9 C) (Oral)   Resp 20   Ht 5\' 10"  (1.778 m)   Wt 103 kg   SpO2 97%   BMI 32.58 kg/m   HEMODYNAMICS:    VENTILATOR SETTINGS:    INTAKE / OUTPUT: I/O last 3 completed shifts: In: 698.4 [P.O.:360; I.V.:338.4] Out: 450 [Urine:450]  PHYSICAL EXAMINATION: General: Obese, in no acute distress HEENT: PERRLA, trachea midline, no JVD Neuro: Alert and oriented x4, no focal deficit Cardiovascular: Apical pulse regular, S1-S2, no murmur regurg or gallop, +2 pulses bilaterally, no edema Lungs: Clear to auscultation bilaterally Abdomen: Benign Musculoskeletal: No joint deformities Skin: Warm and dry  LABS:  BMET Recent Labs  Lab 10/08/18 1724 10/09/18 0225 10/09/18 1958  NA 139 139 138  K 3.9 3.5 3.5  CL 104 106 100  CO2 22 24 26   BUN 19 15 11   CREATININE 0.90 0.75 0.83  GLUCOSE 106* 111* 97    Electrolytes Recent Labs  Lab 10/08/18 1724 10/09/18 0225 10/09/18 1958  CALCIUM 9.4 9.0 9.3  MG  --   --  2.1  PHOS  --   --  2.9    CBC Recent Labs  Lab  10/08/18 1724 10/09/18 0225  WBC 16.4* 12.2*  HGB 14.6 13.0  HCT 43.5 39.1  PLT 368 311    Coag's Recent Labs  Lab 10/08/18 1931  APTT 35  INR 1.0    Sepsis Markers No results for input(s): LATICACIDVEN, PROCALCITON, O2SATVEN in the last 168 hours.  ABG No results for input(s): PHART, PCO2ART, PO2ART in the last 168 hours.  Liver Enzymes Recent Labs  Lab 10/09/18 1958  AST 82*  ALT 47*  ALKPHOS 50  BILITOT 1.4*  ALBUMIN 4.3    Cardiac Enzymes No results for input(s): TROPONINI, PROBNP in the last 168 hours.  Glucose Recent Labs  Lab 10/09/18 0439  GLUCAP 144*    Imaging No results found.   STUDIES:  Left heart catheterization  Conclusion   2nd Diag lesion is 100% stenosed.  Mid LAD lesion is 25% stenosed.    Recommendations  Antiplatelet/Anticoag Recommend uninterrupted dual antiplatelet therapy with Aspirin 81mg  daily and Ticagrelor 90mg  twice daily for a minimum of 12 months (ACS-Class I recommendation).     ASSESSMENT  Non-ST elevation MI now status post left heart catheterization with stent placement to the second diagonal Hyperlipidemia  PLAN Hemodynamic monitoring per ICU protocol Dual antiplatelet therapy per cardiology Trend cardiac enzymes  Best Practice: Code Status: Full code Diet: Heart healthy GI prophylaxis: Not indicated VTE prophylaxis: SCDs  FAMILY  - Updates: Patient updated on current treatment plan  Ecko Beasley S. Tukov-Yual ANP-BC Pulmonary and [place] Pager [phone] or [phone]  NB: This document was prepared using Dragon voice recognition software and may include unintentional dictation errors.    10/10/2018, 6:32 AM

## 2018-10-10 NOTE — Progress Notes (Signed)
*  PRELIMINARY RESULTS* Echocardiogram 2D Echocardiogram has been performed.  Jim Medina Jim Medina 10/10/2018, 10:36 AM

## 2018-10-12 ENCOUNTER — Telehealth: Payer: Self-pay | Admitting: Cardiovascular Disease

## 2018-10-12 ENCOUNTER — Encounter: Payer: Self-pay | Admitting: Internal Medicine

## 2018-10-12 LAB — HIV ANTIBODY (ROUTINE TESTING W REFLEX): HIV Screen 4th Generation wRfx: NONREACTIVE

## 2018-10-12 NOTE — Telephone Encounter (Signed)
-----   Message from Minna Merritts, MD sent at 10/11/2018  3:30 PM EDT ----- Needs TCM Follow-up appointment within 2 weeks [place] or [place]

## 2018-10-12 NOTE — Telephone Encounter (Signed)
We can write letter to restart work with no restrictions if he would like/feels ready

## 2018-10-12 NOTE — Telephone Encounter (Signed)
Patient was left vm to call and schedule on cell. Patients home vm not set up

## 2018-10-12 NOTE — Telephone Encounter (Signed)
Patient called in to schedule follow up. Patient also wanting clearance this week to return to work. Please advise patient on restrictions or clearance, patient has been scheduled on 9/17 with Christell Faith

## 2018-10-12 NOTE — Telephone Encounter (Signed)
Routing to Dr Rockey Situ to address when patient can return to work and address restrictions.

## 2018-10-13 ENCOUNTER — Other Ambulatory Visit: Payer: Self-pay | Admitting: Cardiovascular Disease

## 2018-10-13 MED ORDER — LOSARTAN POTASSIUM 25 MG PO TABS: 25.0000 mg | ORAL_TABLET | Freq: Every day | ORAL | 0 refills | Status: AC

## 2018-10-13 MED ORDER — TICAGRELOR 90 MG PO TABS: 90.0000 mg | ORAL_TABLET | Freq: Two times a day (BID) | ORAL | 0 refills | Status: AC

## 2018-10-13 NOTE — Telephone Encounter (Signed)
°*  STAT* If patient is at the pharmacy, call can be transferred to refill team.   1. Which medications need to be refilled? (please list name of each medication and dose if known)   Losartan 25 mg po q d   brilinta 90 mg po BID   2. Which pharmacy/location (including street and city if local pharmacy) is medication to be sent to?  [facility] [address] [place]   3. Do they need a 30 day or 90 day supply? [place]

## 2018-10-14 NOTE — Telephone Encounter (Signed)
Spoke with patient and reviewed that when he comes in to see provider we can get note for him at that time. He was agreeable with this and had no further questions at this time.

## 2018-10-18 NOTE — Progress Notes (Signed)
Cardiology Office Note    Date:  10/21/2018   ID:  Jim Medina, DOB 1985-09-23, MRN [id number]  PCP:  Patient, No Pcp Per  Cardiologist:  Julien Nordmann, MD  Electrophysiologist:  None   Chief Complaint: Hospital follow-up  History of Present Illness:   Jim Medina is a 33 y.o. male with history of recently diagnosed CAD with non-STEMI in 10/2018 status post PCI/DES to the D2 as outlined below, incidental pulmonary nodule, hyperlipidemia/hypertriglyceridemia, family history of premature CAD in his father who had an MI in his 25s, and tobacco/marijuana abuse who presents for hospital follow-up as detailed below.  Prior to the above admission, the patient did not have any previously known cardiac history.  Patient woke up on 9/4 and did not feel right with developing chest and upper epigastric discomfort throughout the day prompting him to come to the ED with an initial high-sensitivity troponin of 1,552 with a delta of 4,199 and ultimately peaking at 12,962.  Cardiac cath showed mid LAD 25% stenosis, 100% stenosis of moderate to large size D2 status post PCI/DES by outside cardiology group.  Echo on 10/10/2018 showed an EF of 50 to 55%, normal LV cavity size, diastolic dysfunction, anteroseptal/apical wall hypokinesis, normal RV systolic function and cavity size, mildly dilated left atrium, no significant valvular abnormalities.  Post procedure he did well and wanted quick discharge as he needed to get home to help care for his 72-month-old child.  CTA chest was negative for PE with an incidentally noted 1.7 cm solid pulmonary nodule in the posterior left upper lobe.  Discharge labs: A1c 5.5, magnesium 2.1, potassium 3.5, serum creatinine 0.83, albumin 4.3, AST 82, ALT 47, direct LDL 115, total cholesterol 199, triglyceride 423, HDL 32, WBC 12.2, Hgb 13.0, PLT 311  Patient comes in doing well today.  He has not had any chest pain since his PCI.  He has noted some mild SOB, particularly when laying  down trying to sleep, no exertional symptoms.  Compliant with DAPT without missing any doses, outside of his initial outpatient dose in the setting of the pharmacy being closed and another pharmacy not having Brilinta.  No falls, BRBPR, or melena.  No lower extremity swelling, abdominal distension, PND, or orthopnea.  He has not smoked tobacco since his hospital discharge.   Past Medical History:  Diagnosis Date  . CAD (coronary artery disease)    a. NSTEMI 10/2018; b. LHC 10/2018 - mLAD 25%, mod-large D2 100% s/p PCI/DES  . Hyperlipidemia   . Tobacco use     Past Surgical History:  Procedure Laterality Date  . CORONARY STENT INTERVENTION N/A 10/09/2018   Procedure: CORONARY STENT INTERVENTION;  Surgeon: Alwyn Pea, MD;  Location: [facility] INVASIVE CV LAB;  Service: Cardiovascular;  Laterality: N/A;  . LEFT HEART CATH AND CORONARY ANGIOGRAPHY N/A 10/09/2018   Procedure: LEFT HEART CATH AND CORONARY ANGIOGRAPHY poss PCI;  Surgeon: Alwyn Pea, MD;  Location: [facility] INVASIVE CV LAB;  Service: Cardiovascular;  Laterality: N/A;  . NO PAST SURGERIES      Current Medications: Current Meds  Medication Sig  . aspirin EC 81 MG EC tablet Take 1 tablet (81 mg total) by mouth daily.  Marland Kitchen atorvastatin (LIPITOR) 40 MG tablet Take 1 tablet (40 mg total) by mouth daily at 6 PM.  . losartan (COZAAR) 25 MG tablet Take 1 tablet (25 mg total) by mouth daily.  . metoprolol tartrate (LOPRESSOR) 25 MG tablet Take 0.5 tablets (12.5 mg total) by mouth daily  before breakfast.  . ticagrelor (BRILINTA) 90 MG TABS tablet Take 1 tablet (90 mg total) by mouth 2 (two) times daily.    Allergies:   Patient has no known allergies.   Social History   Socioeconomic History  . Marital status: Married    Spouse name: Not on file  . Number of children: Not on file  . Years of education: Not on file  . Highest education level: Not on file  Occupational History  . Not on file  Social Needs  . Financial resource  strain: Not on file  . Food insecurity    Worry: Not on file    Inability: Not on file  . Transportation needs    Medical: Not on file    Non-medical: Not on file  Tobacco Use  . Smoking status: Never Smoker  . Smokeless tobacco: Never Used  Substance and Sexual Activity  . Alcohol use: Yes  . Drug use: Not on file  . Sexual activity: Not on file  Lifestyle  . Physical activity    Days per week: Not on file    Minutes per session: Not on file  . Stress: Not on file  Relationships  . Social Musician on phone: Not on file    Gets together: Not on file    Attends religious service: Not on file    Active member of club or organization: Not on file    Attends meetings of clubs or organizations: Not on file    Relationship status: Not on file  Other Topics Concern  . Not on file  Social History Narrative  . Not on file     Family History:  The patient's family history is not on file.  ROS:   ROS   EKGs/Labs/Other Studies Reviewed:    Studies reviewed were summarized above. The additional studies were reviewed today:  LHC 10/09/2018: Left Anterior Descending  Mid LAD lesion 25% stenosed  Mid LAD lesion is 25% stenosed. Vessel is not the culprit lesion. The lesion is type A, located at the major branch, discrete and concentric. The lesion was not previously treated. The stenosis was measured by a visual reading. Pressure wire/FFR was not performed on the lesion. IVUS was not performed.  Second Diagonal Branch  2nd Diag lesion 100% stenosed  2nd Diag lesion is 100% stenosed. Vessel is the culprit lesion. The lesion is type C, located proximal to the major branch, focal and discrete. The lesion was not previously treated. The stenosis was measured by a visual reading. Pressure wire/FFR was not performed on the lesion. IVUS was not performed.  Intervention  No interventions have been documented.  Wall Motion  Resting       Normal overall left ventricular  function ejection fraction of 55%          Coronary Diagrams  Diagnostic Dominance: Right  Intervention  Implants   Permanent Stent  Stent Resolute Onyx 3.0x15 - [id number] - Implanted Inventory item: Malachi Carl ONYX 3.0X15 Model/Cat number: [id number]  Manufacturer: [employer] Lot number: [id number]  Device identifier: [id number] Device identifier type: GS1  Area Of Implantation: 1st Diagonal    GUDID Information  Request status Successful    Brand name: Resolute OnyxT Version/Model: [id number]  Company name: [employer] MRI safety info as of 10/09/18: MR Conditional  Contains dry or latex rubber: No    GMDN P.T. name: Drug-eluting coronary artery stent, non-bioabsorbable-polymer-coated    As of 10/09/2018  Status: Implanted  __________  2D echo 10/10/2018:  1. The left ventricle has low normal systolic function, with an ejection fraction of 50-55%. The cavity size was normal. There is moderately increased left ventricular wall thickness. Left ventricular diastolic Doppler parameters are consistent with  impaired relaxation. Anteroseptal and apical wall hypokinesis.  2. The right ventricle has normal systolic function. The cavity was normal. There is no increase in right ventricular wall thickness. Unable to estimate RVSP.  3. Left atrial size was mildly dilated.   EKG:  EKG is ordered today.  The EKG ordered today demonstrates sinus bradycardia, 50 bpm, lateral Q waves, anterior TWI (unchanged from prior)  Recent Labs: 10/09/2018: ALT 47; BUN 11; Creatinine, Ser 0.83; Hemoglobin 13.0; Magnesium 2.1; Platelets 311; Potassium 3.5; Sodium 138  Recent Lipid Panel    Component Value Date/Time   CHOL 199 10/09/2018 0225   TRIG 423 (H) 10/09/2018 0225   HDL 32 (L) 10/09/2018 0225   CHOLHDL 6.2 10/09/2018 0225   VLDL UNABLE TO CALCULATE IF TRIGLYCERIDE OVER 400 mg/dL 40/98/1191 4782   LDLCALC UNABLE TO CALCULATE IF TRIGLYCERIDE OVER 400 mg/dL  95/62/1308 6578   LDLDIRECT 115.2 (H) 10/09/2018 0225    PHYSICAL EXAM:    VS:  BP 136/70 (BP Location: Left Arm, Patient Position: Sitting, Cuff Size: Normal)   Pulse (!) 50   Ht 5\' 10"  (1.778 m)   Wt 231 lb (104.8 kg)   BMI 33.15 kg/m   BMI: Body mass index is 33.15 kg/m.  Physical Exam  Constitutional: He is oriented to person, place, and time. He appears well-developed and well-nourished.  HENT:  Head: Normocephalic and atraumatic.  Eyes: Right eye exhibits no discharge. Left eye exhibits no discharge.  Neck: Normal range of motion. No JVD present.  Cardiovascular: Regular rhythm, S1 normal, S2 normal and normal heart sounds. Bradycardia present. Exam reveals no distant heart sounds, no friction rub, no midsystolic click and no opening snap.  No murmur heard. Pulses:      Posterior tibial pulses are 2+ on the right side and 2+ on the left side.  Right femoral cardiac cath site is well-healing without any active bleeding, bruising, swelling, warmth, erythema, or tenderness to palpation.  No bruit.  Pulmonary/Chest: Effort normal and breath sounds normal. No respiratory distress. He has no decreased breath sounds. He has no wheezes. He has no rales. He exhibits no tenderness.  Abdominal: Soft. He exhibits no distension. There is no abdominal tenderness.  Musculoskeletal:        General: No edema.  Neurological: He is alert and oriented to person, place, and time.  Skin: Skin is warm and dry. No cyanosis. Nails show no clubbing.  Psychiatric: He has a normal mood and affect. His speech is normal and behavior is normal. Judgment and thought content normal.    Wt Readings from Last 3 Encounters:  10/21/18 231 lb (104.8 kg)  10/10/18 226 lb 3.2 oz (102.6 kg)  12/13/17 215 lb (97.5 kg)     ASSESSMENT & PLAN:   1. CAD involving native coronaries without angina: He is doing well without any symptoms concerning for angina.  Continue dual antiplatelet therapy with aspirin and  Brilinta without interruption for least the next 12 months.  Continue atorvastatin as outlined below.  Discontinue metoprolol secondary to bradycardic heart rates.  Continued smoking cessation is recommended as detailed below.  Recommend cardiac rehab.  No plans for further ischemic evaluation at this time.  2. Hyperlipidemia/hypertriglyceridemia: Direct LDL of 115 and triglyceride of  423 during recent admission.  Tolerating Lipitor without issues.  Add Vascepa 2 g twice daily today.  Recommend follow-up fasting lipid panel and liver function in 2 months.  Goal LDL less than 70.  3. Sinus bradycardia: Discontinue metoprolol.  No indication for PPM at this time.  Patient has demonstrated appropriate chronotropic response.  If bradycardia persists at his follow-up visit would recommend outpatient cardiac monitoring.  Check TSH if bradycardia persists.  4. Tobacco abuse: Patient indicates he would barely smoke on work breaks and has not smoked since his hospital discharge.  Recommend continued complete cessation from tobacco.  5. Pulmonary nodule: Incidentally noted on recent CTA chest.  Patient will need follow-up chest CT in early 01/2019 per radiology recommendations.  Disposition: F/u with Dr. Mariah Milling or an APP in 2 months.   Medication Adjustments/Labs and Tests Ordered: Current medicines are reviewed at length with the patient today.  Concerns regarding medicines are outlined above. Medication changes, Labs and Tests ordered today are summarized above and listed in the Patient Instructions accessible in Encounters.   Signed, Eula Listen, PA-C 10/21/2018 12:27 PM     [facility] [place] [address] [place], [place] [address] [phone]

## 2018-10-19 ENCOUNTER — Encounter: Payer: Self-pay | Admitting: Physician Assistant

## 2018-10-21 ENCOUNTER — Ambulatory Visit (INDEPENDENT_AMBULATORY_CARE_PROVIDER_SITE_OTHER): Payer: 59 | Admitting: Physician Assistant

## 2018-10-21 ENCOUNTER — Other Ambulatory Visit: Payer: Self-pay

## 2018-10-21 ENCOUNTER — Encounter: Payer: Self-pay | Admitting: Physician Assistant

## 2018-10-21 VITALS — BP 136/70 | HR 50 | Ht 70.0 in | Wt 231.0 lb

## 2018-10-21 DIAGNOSIS — Z720 Tobacco use: Secondary | ICD-10-CM

## 2018-10-21 DIAGNOSIS — E781 Pure hyperglyceridemia: Secondary | ICD-10-CM

## 2018-10-21 DIAGNOSIS — R911 Solitary pulmonary nodule: Secondary | ICD-10-CM

## 2018-10-21 DIAGNOSIS — E785 Hyperlipidemia, unspecified: Secondary | ICD-10-CM | POA: Diagnosis not present

## 2018-10-21 DIAGNOSIS — I2510 Atherosclerotic heart disease of native coronary artery without angina pectoris: Secondary | ICD-10-CM

## 2018-10-21 DIAGNOSIS — R001 Bradycardia, unspecified: Secondary | ICD-10-CM | POA: Diagnosis not present

## 2018-10-21 DIAGNOSIS — E782 Mixed hyperlipidemia: Secondary | ICD-10-CM | POA: Diagnosis not present

## 2018-10-21 MED ORDER — VASCEPA 1 G PO CAPS: 2.0000 | ORAL_CAPSULE | Freq: Two times a day (BID) | ORAL | 5 refills | Status: AC

## 2018-10-21 NOTE — Patient Instructions (Addendum)
Medication Instructions:  Your physician has recommended you make the following change in your medication:  1) STOP Metoprolol  2) START Vascepa 2g twice daily. An Rx has been sent to your pharmacy.  If you need a refill on your cardiac medications before your next appointment, please call your pharmacy.   Lab work: Risk manager today If you have labs (blood work) drawn today and your tests are completely normal, you will receive your results only by: Marland Kitchen MyChart Message (if you have MyChart) OR . A paper copy in the mail If you have any lab test that is abnormal or we need to change your treatment, we will call you to review the results.  Testing/Procedures: None ordered  Follow-Up: At [facility], you and your health needs are our priority.  As part of our continuing mission to provide you with exceptional heart care, we have created designated Provider Care Teams.  These Care Teams include your primary Cardiologist (physician) and Advanced Practice Providers (APPs -  Physician Assistants and Nurse Practitioners) who all work together to provide you with the care you need, when you need it. You will need a follow up appointment in 2 months.  You may see Ida Rogue, MD or one of the following Advanced Practice Providers on your designated Care Team:   Murray Hodgkins, NP Christell Faith, PA-C . Marrianne Mood, PA-C  Any Other Special Instructions Will Be Listed Below (If Applicable). You can return to work on Monday 10/25/18 with no restrictions

## 2018-10-22 ENCOUNTER — Telehealth: Payer: Self-pay | Admitting: *Deleted

## 2018-10-22 LAB — CBC WITH DIFFERENTIAL/PLATELET
Basophils Absolute: 0 10*3/uL (ref 0.0–0.2)
Basos: 0 %
EOS (ABSOLUTE): 0.3 10*3/uL (ref 0.0–0.4)
Eos: 3 %
Hematocrit: 41.2 % (ref 37.5–51.0)
Hemoglobin: 14.4 g/dL (ref 13.0–17.7)
Immature Grans (Abs): 0 10*3/uL (ref 0.0–0.1)
Immature Granulocytes: 0 %
Lymphocytes Absolute: 2.8 10*3/uL (ref 0.7–3.1)
Lymphs: 30 %
MCH: 28.2 pg (ref 26.6–33.0)
MCHC: 35 g/dL (ref 31.5–35.7)
MCV: 81 fL (ref 79–97)
Monocytes Absolute: 0.5 10*3/uL (ref 0.1–0.9)
Monocytes: 6 %
Neutrophils Absolute: 5.4 10*3/uL (ref 1.4–7.0)
Neutrophils: 61 %
Platelets: 383 10*3/uL (ref 150–450)
RBC: 5.1 x10E6/uL (ref 4.14–5.80)
RDW: 12.8 % (ref 11.6–15.4)
WBC: 9 10*3/uL (ref 3.4–10.8)

## 2018-10-22 LAB — BASIC METABOLIC PANEL
BUN/Creatinine Ratio: 16 (ref 9–20)
BUN: 13 mg/dL (ref 6–20)
CO2: 22 mmol/L (ref 20–29)
Calcium: 10.2 mg/dL (ref 8.7–10.2)
Chloride: 103 mmol/L (ref 96–106)
Creatinine, Ser: 0.8 mg/dL (ref 0.76–1.27)
GFR calc Af Amer: 138 mL/min/{1.73_m2} (ref >59)
GFR calc non Af Amer: 119 mL/min/{1.73_m2} (ref >59)
Glucose: 99 mg/dL (ref 65–99)
Potassium: 4.5 mmol/L (ref 3.5–5.2)
Sodium: 139 mmol/L (ref 134–144)

## 2018-10-22 NOTE — Telephone Encounter (Signed)
Reviewed results with patient and he verbalized understanding with no further questions at this time. 

## 2018-10-22 NOTE — Telephone Encounter (Signed)
-----   Message from Rise Mu, PA-C sent at 10/22/2018  7:11 AM EDT ----- Kidney function normal.   Potassium at goal.   Random glucose normal. Blood count normal.  No changes from office visit.

## 2018-10-22 NOTE — Telephone Encounter (Signed)
Left voicemail message to call back for results.  

## 2018-12-18 DIAGNOSIS — I2510 Atherosclerotic heart disease of native coronary artery without angina pectoris: Secondary | ICD-10-CM | POA: Insufficient documentation

## 2018-12-18 DIAGNOSIS — F17200 Nicotine dependence, unspecified, uncomplicated: Secondary | ICD-10-CM | POA: Insufficient documentation

## 2018-12-21 ENCOUNTER — Ambulatory Visit: Payer: 59 | Admitting: Cardiovascular Disease

## 2022-06-20 ENCOUNTER — Emergency Department (HOSPITAL_COMMUNITY)
Admission: EM | Admit: 2022-06-20 | Discharge: 2022-06-20 | Disposition: A | Discharge status: Home / Self Care | Payer: 59 | Attending: Emergency Medicine | Admitting: Emergency Medicine

## 2022-06-20 ENCOUNTER — Other Ambulatory Visit: Payer: Self-pay

## 2022-06-20 DIAGNOSIS — E860 Dehydration: Secondary | ICD-10-CM | POA: Diagnosis not present

## 2022-06-20 DIAGNOSIS — Z7982 Long term (current) use of aspirin: Secondary | ICD-10-CM | POA: Insufficient documentation

## 2022-06-20 DIAGNOSIS — I2510 Atherosclerotic heart disease of native coronary artery without angina pectoris: Secondary | ICD-10-CM | POA: Insufficient documentation

## 2022-06-20 DIAGNOSIS — R112 Nausea with vomiting, unspecified: Secondary | ICD-10-CM

## 2022-06-20 DIAGNOSIS — R197 Diarrhea, unspecified: Secondary | ICD-10-CM | POA: Insufficient documentation

## 2022-06-20 LAB — CBC WITH DIFFERENTIAL/PLATELET
Abs Immature Granulocytes: 0.03 10*3/uL (ref 0.00–0.07)
Basophils Absolute: 0 10*3/uL (ref 0.0–0.1)
Basophils Relative: 0 %
Eosinophils Absolute: 0.1 10*3/uL (ref 0.0–0.5)
Eosinophils Relative: 2 %
HCT: 46 % (ref 39.0–52.0)
Hemoglobin: 14.8 g/dL (ref 13.0–17.0)
Immature Granulocytes: 0 %
Lymphocytes Relative: 24 %
Lymphs Abs: 2 10*3/uL (ref 0.7–4.0)
MCH: 27.4 pg (ref 26.0–34.0)
MCHC: 32.2 g/dL (ref 30.0–36.0)
MCV: 85 fL (ref 80.0–100.0)
Monocytes Absolute: 0.6 10*3/uL (ref 0.1–1.0)
Monocytes Relative: 7 %
Neutro Abs: 5.5 10*3/uL (ref 1.7–7.7)
Neutrophils Relative %: 67 %
Platelets: 374 10*3/uL (ref 150–400)
RBC: 5.41 MIL/uL (ref 4.22–5.81)
RDW: 14.1 % (ref 11.5–15.5)
WBC: 8.2 10*3/uL (ref 4.0–10.5)
nRBC: 0 % (ref 0.0–0.2)

## 2022-06-20 LAB — COMPREHENSIVE METABOLIC PANEL
ALT: 40 U/L (ref 0–44)
AST: 23 U/L (ref 15–41)
Albumin: 4.4 g/dL (ref 3.5–5.0)
Alkaline Phosphatase: 50 U/L (ref 38–126)
Anion gap: 13 (ref 5–15)
BUN: 13 mg/dL (ref 6–20)
CO2: 24 mmol/L (ref 22–32)
Calcium: 8.9 mg/dL (ref 8.9–10.3)
Chloride: 103 mmol/L (ref 98–111)
Creatinine, Ser: 0.85 mg/dL (ref 0.61–1.24)
GFR, Estimated: >60 mL/min (ref >60)
Glucose, Bld: 110 mg/dL — ABNORMAL HIGH (ref 70–99)
Potassium: 3.9 mmol/L (ref 3.5–5.1)
Sodium: 140 mmol/L (ref 135–145)
Total Bilirubin: 1.2 mg/dL (ref 0.3–1.2)
Total Protein: 7.6 g/dL (ref 6.5–8.1)

## 2022-06-20 LAB — URINALYSIS, ROUTINE W REFLEX MICROSCOPIC
Bilirubin Urine: NEGATIVE
Glucose, UA: NEGATIVE mg/dL
Hgb urine dipstick: NEGATIVE
Ketones, ur: NEGATIVE mg/dL
Leukocytes,Ua: NEGATIVE
Nitrite: NEGATIVE
Protein, ur: NEGATIVE mg/dL
Specific Gravity, Urine: 1.028 (ref 1.005–1.030)
pH: 5 (ref 5.0–8.0)

## 2022-06-20 LAB — LIPASE, BLOOD: Lipase: 28 U/L (ref 11–51)

## 2022-06-20 MED ORDER — METRONIDAZOLE 500 MG PO TABS: 500.0000 mg | ORAL_TABLET | Freq: Two times a day (BID) | ORAL | 0 refills | Status: AC

## 2022-06-20 MED ORDER — ONDANSETRON HCL 4 MG/2ML IJ SOLN
4.0000 mg | Freq: Once | INTRAMUSCULAR | Status: AC
  Administered 2022-06-20: 4 mg via INTRAVENOUS
  Filled 2022-06-20: qty 2

## 2022-06-20 MED ORDER — FAMOTIDINE IN NACL 20-0.9 MG/50ML-% IV SOLN
20.0000 mg | Freq: Once | INTRAVENOUS | Status: AC
  Administered 2022-06-20: 20 mg via INTRAVENOUS
  Filled 2022-06-20: qty 50

## 2022-06-20 MED ORDER — CHLORHEXIDINE GLUCONATE 0.12 % MT SOLN: 15.0000 mL | Freq: Two times a day (BID) | OROMUCOSAL | 0 refills | Status: AC

## 2022-06-20 MED ORDER — SODIUM CHLORIDE 0.9 % IV BOLUS
1000.0000 mL | Freq: Once | INTRAVENOUS | Status: AC
  Administered 2022-06-20: 1000 mL via INTRAVENOUS

## 2022-06-20 MED ORDER — ONDANSETRON 4 MG PO TBDP: 4.0000 mg | ORAL_TABLET | Freq: Three times a day (TID) | ORAL | 0 refills | Status: AC | PRN

## 2022-06-20 NOTE — ED Notes (Signed)
Pt in bed, pt denies pain, pt states that he is ready to go home, pt verbalized understanding d/c and follow up, pt ambulatory from dpt

## 2022-06-20 NOTE — ED Notes (Signed)
Ginger ale given to pt by provider, pt states that he is feeling much better, pt denies vomiting.

## 2022-06-20 NOTE — ED Provider Notes (Signed)
[place] EMERGENCY DEPARTMENT AT [facility] Provider Note   CSN: [id number] Arrival date & time: 06/20/22  4098     History  Chief Complaint  Patient presents with   Emesis    Jim Medina is a 37 y.o. male.  Pt is a 37 yo male with pmhx significant for hld, cad, and tobacco abuse.  Pt was diagnosed with a dental infection about a week ago and was initially prescribed pcn.  He still had the infection, so abx was changed to clindamycin 2 days ago.  Pt started having diarrhea and vomiting and is unable to keep anything down.         Home Medications Prior to Admission medications   Medication Sig Start Date End Date Taking? Authorizing Provider  chlorhexidine (PERIDEX) 0.12 % solution Use as directed 15 mLs in the mouth or throat 2 (two) times daily. 06/20/22  Yes Jacalyn Lefevre, MD  metroNIDAZOLE (FLAGYL) 500 MG tablet Take 1 tablet (500 mg total) by mouth 2 (two) times daily. 06/20/22  Yes Jacalyn Lefevre, MD  ondansetron (ZOFRAN-ODT) 4 MG disintegrating tablet Take 1 tablet (4 mg total) by mouth every 8 (eight) hours as needed for nausea or vomiting. 06/20/22  Yes Jacalyn Lefevre, MD  aspirin EC 81 MG EC tablet Take 1 tablet (81 mg total) by mouth daily. 10/11/18   Katha Hamming, MD  atorvastatin (LIPITOR) 40 MG tablet Take 1 tablet (40 mg total) by mouth daily at 6 PM. 10/10/18   Katha Hamming, MD  Icosapent Ethyl (VASCEPA) 1 g CAPS Take 2 capsules (2 g total) by mouth 2 (two) times daily. 10/21/18   Dunn, Raymon Mutton, PA-C  losartan (COZAAR) 25 MG tablet Take 1 tablet (25 mg total) by mouth daily. 10/13/18   Antonieta Iba, MD  ticagrelor (BRILINTA) 90 MG TABS tablet Take 1 tablet (90 mg total) by mouth 2 (two) times daily. 10/13/18   Antonieta Iba, MD      Allergies    Patient has no known allergies.    Review of Systems   Review of Systems  Gastrointestinal:  Positive for abdominal pain, diarrhea, nausea and vomiting.  All other systems reviewed and are  negative.   Physical Exam Updated Vital Signs BP 139/84 (BP Location: Right Arm)   Pulse 71   Temp 98.4 F (36.9 C)   Resp 16   SpO2 100%  Physical Exam Vitals and nursing note reviewed.  Constitutional:      Appearance: Normal appearance. He is obese.  HENT:     Head: Normocephalic and atraumatic.     Right Ear: External ear normal.     Left Ear: External ear normal.     Nose: Nose normal.     Mouth/Throat:     Mouth: Mucous membranes are dry.  Eyes:     Extraocular Movements: Extraocular movements intact.     Conjunctiva/sclera: Conjunctivae normal.     Pupils: Pupils are equal, round, and reactive to light.  Cardiovascular:     Rate and Rhythm: Normal rate and regular rhythm.     Pulses: Normal pulses.     Heart sounds: Normal heart sounds.  Pulmonary:     Effort: Pulmonary effort is normal.     Breath sounds: Normal breath sounds.  Abdominal:     General: Abdomen is flat. Bowel sounds are normal.     Palpations: Abdomen is soft.  Musculoskeletal:        General: Normal range of motion.  Cervical back: Normal range of motion and neck supple.  Skin:    General: Skin is warm.     Capillary Refill: Capillary refill takes less than 2 seconds.  Neurological:     General: No focal deficit present.     Mental Status: He is alert and oriented to person, place, and time.  Psychiatric:        Mood and Affect: Mood normal.        Behavior: Behavior normal.     ED Results / Procedures / Treatments   Labs (all labs ordered are listed, but only abnormal results are displayed) Labs Reviewed  COMPREHENSIVE METABOLIC PANEL - Abnormal; Notable for the following components:      Result Value   Glucose, Bld 110 (*)    All other components within normal limits  C DIFFICILE QUICK SCREEN W PCR REFLEX    CBC WITH DIFFERENTIAL/PLATELET  LIPASE, BLOOD  URINALYSIS, ROUTINE W REFLEX MICROSCOPIC    EKG None  Radiology No results found.  Procedures Procedures     Medications Ordered in ED Medications  sodium chloride 0.9 % bolus 1,000 mL (0 mLs Intravenous Stopped 06/20/22 1209)  ondansetron (ZOFRAN) injection 4 mg (4 mg Intravenous Given 06/20/22 1016)  famotidine (PEPCID) IVPB 20 mg premix (0 mg Intravenous Stopped 06/20/22 1049)    ED Course/ Medical Decision Making/ A&P                             Medical Decision Making Amount and/or Complexity of Data Reviewed Labs: ordered.  Risk Prescription drug management.   This patient presents to the ED for concern of n/v/d, this involves an extensive number of treatment options, and is a complaint that carries with it a high risk of complications and morbidity.  The differential diagnosis includes infection, abx rxn, electrolyte abn   Co morbidities that complicate the patient evaluation  hld, cad, and tobacco abuse   Additional history obtained:  Additional history obtained from epic chart review  Lab Tests:  I Ordered, and personally interpreted labs.  The pertinent results include:  cbc nl, ua  nl, cmp nl  Cardiac Monitoring:  The patient was maintained on a cardiac monitor.  I personally viewed and interpreted the cardiac monitored which showed an underlying rhythm of: nsr   Medicines ordered and prescription drug management:  I ordered medication including ivfs/zofran  for sx  Reevaluation of the patient after these medicines showed that the patient improved I have reviewed the patients home medicines and have made adjustments as needed   Problem List / ED Course:  N/v/d:  likely due to clinda intolerance.  No diarrhea here, so no stool sent for c.diff.  pt is now able to tolerate po fluids.  He is to return if worse.  F/u with pcp. Dental infection:  pt told to stop clinda.  Flagyl added.  Peridex added.  F/u with dentist   Reevaluation:  After the interventions noted above, I reevaluated the patient and found that they have :improved   Social Determinants of  Health:  Lives at home   Dispostion:  After consideration of the diagnostic results and the patients response to treatment, I feel that the patent would benefit from discharge with outpatient f/u.          Final Clinical Impression(s) / ED Diagnoses Final diagnoses:  Dehydration  Nausea vomiting and diarrhea    Rx / DC Orders ED Discharge Orders  Ordered    metroNIDAZOLE (FLAGYL) 500 MG tablet  2 times daily        06/20/22 1244    ondansetron (ZOFRAN-ODT) 4 MG disintegrating tablet  Every 8 hours PRN        06/20/22 1244    chlorhexidine (PERIDEX) 0.12 % solution  2 times daily        06/20/22 1244              Jacalyn Lefevre, MD 06/20/22 1247

## 2022-06-20 NOTE — ED Triage Notes (Signed)
Pt reports dental infection and completing course of penicillin without relief so was started on another abx two days ago. Drinking probiotic water but still having GI upset. N/v/d and abdominal pain. Drinking probiotic water without improvement.

## 2022-10-20 ENCOUNTER — Emergency Department (HOSPITAL_COMMUNITY)
Admission: EM | Admit: 2022-10-20 | Discharge: 2022-10-20 | Disposition: A | Discharge status: Home / Self Care | Payer: Worker's Compensation | Attending: Emergency Medicine | Admitting: Emergency Medicine

## 2022-10-20 ENCOUNTER — Other Ambulatory Visit: Payer: Self-pay

## 2022-10-20 ENCOUNTER — Emergency Department (HOSPITAL_COMMUNITY): Payer: Worker's Compensation

## 2022-10-20 ENCOUNTER — Encounter (HOSPITAL_COMMUNITY): Payer: Self-pay

## 2022-10-20 DIAGNOSIS — Z7982 Long term (current) use of aspirin: Secondary | ICD-10-CM | POA: Insufficient documentation

## 2022-10-20 DIAGNOSIS — W312XXA Contact with powered woodworking and forming machines, initial encounter: Secondary | ICD-10-CM | POA: Insufficient documentation

## 2022-10-20 DIAGNOSIS — S61311A Laceration without foreign body of left index finger with damage to nail, initial encounter: Secondary | ICD-10-CM | POA: Insufficient documentation

## 2022-10-20 DIAGNOSIS — S62661A Nondisplaced fracture of distal phalanx of left index finger, initial encounter for closed fracture: Secondary | ICD-10-CM | POA: Diagnosis not present

## 2022-10-20 DIAGNOSIS — S6992XA Unspecified injury of left wrist, hand and finger(s), initial encounter: Secondary | ICD-10-CM | POA: Diagnosis present

## 2022-10-20 MED ORDER — CEPHALEXIN 250 MG PO CAPS
500.0000 mg | ORAL_CAPSULE | Freq: Once | ORAL | Status: AC
  Administered 2022-10-20: 500 mg via ORAL
  Filled 2022-10-20: qty 2

## 2022-10-20 MED ORDER — OXYCODONE-ACETAMINOPHEN 5-325 MG PO TABS: 1.0000 | ORAL_TABLET | Freq: Four times a day (QID) | ORAL | 0 refills | Status: DC | PRN

## 2022-10-20 MED ORDER — OXYCODONE-ACETAMINOPHEN 5-325 MG PO TABS: 1.0000 | ORAL_TABLET | Freq: Four times a day (QID) | ORAL | 0 refills | Status: AC | PRN

## 2022-10-20 MED ORDER — CEPHALEXIN 500 MG PO CAPS: 500.0000 mg | ORAL_CAPSULE | Freq: Four times a day (QID) | ORAL | 0 refills | Status: AC

## 2022-10-20 MED ORDER — CEPHALEXIN 500 MG PO CAPS: 500.0000 mg | ORAL_CAPSULE | Freq: Four times a day (QID) | ORAL | 0 refills | Status: DC

## 2022-10-20 MED ORDER — OXYCODONE-ACETAMINOPHEN 5-325 MG PO TABS
2.0000 | ORAL_TABLET | Freq: Once | ORAL | Status: AC
  Administered 2022-10-20: 2 via ORAL
  Filled 2022-10-20: qty 2

## 2022-10-20 MED ORDER — LIDOCAINE HCL (PF) 1 % IJ SOLN
5.0000 mL | Freq: Once | INTRAMUSCULAR | Status: AC
  Administered 2022-10-20: 5 mL
  Filled 2022-10-20: qty 5

## 2022-10-20 MED ORDER — OXYCODONE-ACETAMINOPHEN 5-325 MG PO TABS
1.0000 | ORAL_TABLET | ORAL | Status: DC | PRN
  Administered 2022-10-20: 1 via ORAL
  Filled 2022-10-20: qty 1

## 2022-10-20 NOTE — Discharge Instructions (Signed)
You were seen in the emergency department for finger laceration.  This was repaired here in the emergency department after consultation with the hand surgeon.  With Dr. Roberts Gaudy office directly and they will plan on seeing you this week on Wednesday for evaluation.  In the meantime, he will be started on antibiotics to reduce risk of infection and also pain medicine for symptom control at home.  Given the extent of your injury with the laceration as well as the fracture to the distal portion of your left index finger, there may be need for further intervention with the hand surgeon so please make sure you follow-up with them.  If you have any concerns that symptoms are worsening, return to the emergency department.

## 2022-10-20 NOTE — ED Triage Notes (Signed)
Pt came in via POV from work d/t catching his Lt index finger on a table saw. Reports the tip is numb now & the pain is 8/10, A/Ox4. Bleeding controlled.

## 2022-10-20 NOTE — ED Notes (Signed)
Patient verbalizes understanding of discharge instructions. Opportunity for questioning and answers were provided. Armband removed by staff, pt discharged from ED. Pt ambulatory to ED waiting room with steady gait.  

## 2022-10-20 NOTE — ED Provider Notes (Addendum)
EMERGENCY DEPARTMENT AT [facility] Provider Note   CSN: [id number] Arrival date & time: 10/20/22  1502     History Chief Complaint  Patient presents with   Finger Lac    Jim Medina is a 37 y.o. male.  Patient presents the emergency department concerns of finger laceration.  Reports that he was working on a table saw when he slipped and cut his left index finger.  Reports that he is up-to-date on Tdap.  Denies any blood thinner use.  Bleeding controlled prior to arriving emergency department.  His pain at bedtime at this time.  HPI     Home Medications Prior to Admission medications   Medication Sig Start Date End Date Taking? Authorizing Provider  aspirin EC 81 MG EC tablet Take 1 tablet (81 mg total) by mouth daily. 10/11/18   Katha Hamming, MD  atorvastatin (LIPITOR) 40 MG tablet Take 1 tablet (40 mg total) by mouth daily at 6 PM. 10/10/18   Katha Hamming, MD  cephALEXin (KEFLEX) 500 MG capsule Take 1 capsule (500 mg total) by mouth 4 (four) times daily for 7 days. 10/20/22 10/27/22  Smitty Knudsen, PA-C  chlorhexidine (PERIDEX) 0.12 % solution Use as directed 15 mLs in the mouth or throat 2 (two) times daily. 06/20/22   Jacalyn Lefevre, MD  Icosapent Ethyl (VASCEPA) 1 g CAPS Take 2 capsules (2 g total) by mouth 2 (two) times daily. 10/21/18   Dunn, Raymon Mutton, PA-C  losartan (COZAAR) 25 MG tablet Take 1 tablet (25 mg total) by mouth daily. 10/13/18   Antonieta Iba, MD  metroNIDAZOLE (FLAGYL) 500 MG tablet Take 1 tablet (500 mg total) by mouth 2 (two) times daily. 06/20/22   Jacalyn Lefevre, MD  ondansetron (ZOFRAN-ODT) 4 MG disintegrating tablet Take 1 tablet (4 mg total) by mouth every 8 (eight) hours as needed for nausea or vomiting. 06/20/22   Jacalyn Lefevre, MD  oxyCODONE-acetaminophen (PERCOCET/ROXICET) 5-325 MG tablet Take 1 tablet by mouth every 6 (six) hours as needed for severe pain. 10/20/22   Smitty Knudsen, PA-C  ticagrelor (BRILINTA) 90 MG  TABS tablet Take 1 tablet (90 mg total) by mouth 2 (two) times daily. 10/13/18   Antonieta Iba, MD      Allergies    Patient has no known allergies.    Review of Systems   Review of Systems  Skin:  Positive for wound.  All other systems reviewed and are negative.   Physical Exam Updated Vital Signs BP 138/84 (BP Location: Right Arm)   Pulse 60   Temp 98.4 F (36.9 C) (Oral)   Resp 15   Ht 5\' 11"  (1.803 m)   Wt 104.3 kg   SpO2 96%   BMI 32.08 kg/m  Physical Exam Vitals and nursing note reviewed.  HENT:     Head: Normocephalic and atraumatic.  Eyes:     General: No scleral icterus.       Right eye: No discharge.        Left eye: No discharge.  Cardiovascular:     Rate and Rhythm: Normal rate and regular rhythm.  Musculoskeletal:        General: Tenderness, deformity and signs of injury present. No swelling.  Skin:    Findings: Lesion present. No rash.     Comments: Multiple lesions to the left index finger. No obvious          ED Results / Procedures / Treatments   Labs (all  labs ordered are listed, but only abnormal results are displayed) Labs Reviewed - No data to display  EKG None  Radiology DG Finger Index Left  Result Date: 10/20/2022 CLINICAL DATA:  Cut left index finger with table saw. EXAM: LEFT INDEX FINGER 2+V COMPARISON:  None Available. FINDINGS: There is a comminuted fracture and suspected laceration involving the distal phalanx of the second digit. No definite intra-articular extension. No radiopaque foreign body. Joint spaces are preserved. IMPRESSION: Comminuted fracture and suspected laceration involving the distal phalanx of the second digit without definite intra-articular extension or radiopaque foreign body. Electronically Signed   By: Simonne Come M.D.   On: 10/20/2022 16:40    Procedures .Marland KitchenLaceration Repair  Date/Time: 10/20/2022 10:07 PM  Performed by: Smitty Knudsen, PA-C Authorized by: Smitty Knudsen, PA-C   Consent:     Consent obtained:  Verbal   Consent given by:  Patient   Risks discussed:  Infection and pain Universal protocol:    Patient identity confirmed:  Verbally with patient and arm band Anesthesia:    Anesthesia method:  Nerve block   Block location:  Left index finger base   Block needle gauge:  27 G   Block anesthetic:  Lidocaine 1% w/o epi   Block technique:  Digital block   Block injection procedure:  Introduced needle, incremental injection, anatomic landmarks palpated, negative aspiration for blood and anatomic landmarks identified   Block outcome:  Anesthesia achieved Laceration details:    Length (cm):  4   Depth (mm):  2 Pre-procedure details:    Preparation:  Patient was prepped and draped in usual sterile fashion and imaging obtained to evaluate for foreign bodies Exploration:    Limited defect created (wound extended): yes     Hemostasis achieved with:  Direct pressure   Imaging obtained: x-ray     Imaging outcome: foreign body not noted     Wound exploration: wound explored through full range of motion and entire depth of wound visualized     Contaminated: no   Treatment:    Area cleansed with:  Povidone-iodine and saline   Amount of cleaning:  Extensive   Irrigation solution:  Sterile saline   Irrigation volume:  1000cc   Irrigation method:  Syringe Skin repair:    Repair method:  Sutures   Suture size:  5-0   Suture material:  Chromic gut   Suture technique:  Simple interrupted   Number of sutures:  8 Approximation:    Approximation:  Loose Repair type:    Repair type:  Intermediate Post-procedure details:    Dressing:  Non-adherent dressing   Procedure completion:  Tolerated    Medications Ordered in ED Medications  oxyCODONE-acetaminophen (PERCOCET/ROXICET) 5-325 MG per tablet 1 tablet (1 tablet Oral Given 10/20/22 1558)  oxyCODONE-acetaminophen (PERCOCET/ROXICET) 5-325 MG per tablet 2 tablet (2 tablets Oral Given 10/20/22 2026)  lidocaine (PF) (XYLOCAINE)  1 % injection 5 mL (5 mLs Infiltration Given 10/20/22 2026)  cephALEXin (KEFLEX) capsule 500 mg (500 mg Oral Given 10/20/22 2150)    ED Course/ Medical Decision Making/ A&P                               Medical Decision Making Risk Prescription drug management.   This patient presents to the ED for concern of finger laceration.  Differential diagnosis includes open fracture, nail avulsion, distal phalanx fracture, dislocation   Imaging Studies ordered:  I ordered imaging  studies including x-ray of left index finger I independently visualized and interpreted imaging which showed comminuted fracture of the distal phalanx of the left index finger I agree with the radiologist interpretation   Medicines ordered and prescription drug management:  I ordered medication including Percocet, Keflex for pain, infection prevention Reevaluation of the patient after these medicines showed that the patient improved I have reviewed the patients home medicines and have made adjustments as needed   Problem List / ED Course:  Patient presents to the ER with concerns of a finger laceration.  Reports that he was using a table saw when he cut his left index finger.  States he had considerable bleeding at that time but bleeding well-controlled now at this point.  States that he is having numbness in the tip of the left index finger but also reports pain in general is 8 out of 10.  Not currently on blood thinners.  Order x-ray imaging for evaluation of symptoms.  Percocet given in triage given mechanism of injury. X-ray imaging concerning for a comminuted fracture of the left distal index finger.  Notable laceration also seen.  X-ray does not show any obvious signs of an intra-articular finding. Spoke with hand surgery, who advised closing with absorbable suture but revision would likely be needed. Dr. Yehuda Budd with [facility] will be seeing patient on 10/22/2022. Laceration repair performed without  complications.  Patient tolerated well.  Discussed need for outpatient follow-up and strict return precautions.  Final Clinical Impression(s) / ED Diagnoses Final diagnoses:  Laceration of left index finger without foreign body with damage to nail, initial encounter  Closed nondisplaced fracture of distal phalanx of left index finger, initial encounter    Rx / DC Orders ED Discharge Orders          Ordered    cephALEXin (KEFLEX) 500 MG capsule  4 times daily,   Status:  Discontinued        10/20/22 2156    oxyCODONE-acetaminophen (PERCOCET/ROXICET) 5-325 MG tablet  Every 6 hours PRN,   Status:  Discontinued        10/20/22 2156    oxyCODONE-acetaminophen (PERCOCET/ROXICET) 5-325 MG tablet  Every 6 hours PRN        10/20/22 2203    cephALEXin (KEFLEX) 500 MG capsule  4 times daily        10/20/22 2203              Smitty Knudsen, PA-C 10/20/22 2207    Smitty Knudsen, PA-C 10/20/22 2209    Tegeler, Canary Brim, MD 10/20/22 2342

## 2022-10-20 NOTE — ED Notes (Signed)
New dressing applied to finger

## 2022-12-19 ENCOUNTER — Ambulatory Visit: Payer: Self-pay | Admitting: Family Medicine

## 2022-12-24 ENCOUNTER — Ambulatory Visit: Payer: Self-pay | Admitting: Physician Assistant

## 2023-01-26 ENCOUNTER — Ambulatory Visit: Payer: 59 | Admitting: Family Medicine
